# Patient Record
Sex: Female | Born: 1980 | Race: White | Hispanic: No | Marital: Married | Smoking: Never smoker
Health system: Southern US, Community
[De-identification: ages and names within clinical notes are randomized; demographics above are authoritative.]

## PROBLEM LIST (undated history)

## (undated) DIAGNOSIS — R519 Headache, unspecified: Secondary | ICD-10-CM

## (undated) DIAGNOSIS — K219 Gastro-esophageal reflux disease without esophagitis: Secondary | ICD-10-CM

## (undated) DIAGNOSIS — N200 Calculus of kidney: Secondary | ICD-10-CM

## (undated) DIAGNOSIS — R51 Headache: Secondary | ICD-10-CM

## (undated) DIAGNOSIS — O1205 Gestational edema, complicating the puerperium: Secondary | ICD-10-CM

## (undated) HISTORY — PX: WISDOM TOOTH EXTRACTION: SHX21

---

## 2002-04-04 ENCOUNTER — Other Ambulatory Visit: Admission: RE | Admit: 2002-04-04 | Discharge: 2002-04-04 | Payer: Self-pay | Admitting: Obstetrics and Gynecology

## 2002-07-14 ENCOUNTER — Ambulatory Visit (HOSPITAL_COMMUNITY): Admission: RE | Admit: 2002-07-14 | Discharge: 2002-07-14 | Payer: Self-pay | Admitting: Family Medicine

## 2002-07-14 ENCOUNTER — Encounter: Payer: Self-pay | Admitting: Family Medicine

## 2003-04-30 ENCOUNTER — Other Ambulatory Visit: Admission: RE | Admit: 2003-04-30 | Discharge: 2003-04-30 | Payer: Self-pay | Admitting: Obstetrics and Gynecology

## 2004-09-16 ENCOUNTER — Other Ambulatory Visit: Admission: RE | Admit: 2004-09-16 | Discharge: 2004-09-16 | Payer: Self-pay | Admitting: Obstetrics and Gynecology

## 2005-12-02 ENCOUNTER — Other Ambulatory Visit: Admission: RE | Admit: 2005-12-02 | Discharge: 2005-12-02 | Payer: Self-pay | Admitting: Obstetrics and Gynecology

## 2013-10-06 ENCOUNTER — Other Ambulatory Visit (HOSPITAL_COMMUNITY): Payer: Self-pay | Admitting: Obstetrics and Gynecology

## 2013-10-06 DIAGNOSIS — N96 Recurrent pregnancy loss: Secondary | ICD-10-CM

## 2013-10-12 ENCOUNTER — Ambulatory Visit (HOSPITAL_COMMUNITY)
Admission: RE | Admit: 2013-10-12 | Discharge: 2013-10-12 | Disposition: A | Discharge status: Home / Self Care | Payer: PRIVATE HEALTH INSURANCE | Source: Ambulatory Visit | Attending: Obstetrics and Gynecology | Admitting: Obstetrics and Gynecology

## 2013-10-12 DIAGNOSIS — N979 Female infertility, unspecified: Secondary | ICD-10-CM | POA: Insufficient documentation

## 2013-10-12 DIAGNOSIS — N96 Recurrent pregnancy loss: Secondary | ICD-10-CM

## 2013-10-12 MED ORDER — IOHEXOL 300 MG/ML  SOLN
20.0000 mL | Freq: Once | INTRAMUSCULAR | Status: AC | PRN
  Administered 2013-10-12: 10 mL

## 2014-09-19 ENCOUNTER — Other Ambulatory Visit: Payer: Self-pay | Admitting: Obstetrics and Gynecology

## 2014-10-02 ENCOUNTER — Other Ambulatory Visit (INDEPENDENT_AMBULATORY_CARE_PROVIDER_SITE_OTHER): Payer: PRIVATE HEALTH INSURANCE

## 2014-10-02 DIAGNOSIS — Z01818 Encounter for other preprocedural examination: Secondary | ICD-10-CM

## 2014-10-02 NOTE — Progress Notes (Signed)
Lab only ordered by outside doctor

## 2014-10-03 ENCOUNTER — Encounter (HOSPITAL_COMMUNITY): Payer: Self-pay

## 2014-10-03 ENCOUNTER — Encounter (HOSPITAL_COMMUNITY)
Admission: RE | Admit: 2014-10-03 | Discharge: 2014-10-03 | Disposition: A | Discharge status: Home / Self Care | Payer: PRIVATE HEALTH INSURANCE | Source: Ambulatory Visit | Attending: Obstetrics and Gynecology | Admitting: Obstetrics and Gynecology

## 2014-10-03 DIAGNOSIS — Z01812 Encounter for preprocedural laboratory examination: Secondary | ICD-10-CM | POA: Diagnosis not present

## 2014-10-03 HISTORY — DX: Headache: R51

## 2014-10-03 HISTORY — DX: Gastro-esophageal reflux disease without esophagitis: K21.9

## 2014-10-03 HISTORY — DX: Calculus of kidney: N20.0

## 2014-10-03 HISTORY — DX: Headache, unspecified: R51.9

## 2014-10-03 LAB — CBC WITH DIFFERENTIAL
BASOS ABS: 0 10*3/uL (ref 0.0–0.2)
Basos: 0 %
EOS: 3 %
Eosinophils Absolute: 0.1 10*3/uL (ref 0.0–0.4)
HCT: 38.8 % (ref 34.0–46.6)
Hemoglobin: 13.5 g/dL (ref 11.1–15.9)
IMMATURE GRANS (ABS): 0 10*3/uL (ref 0.0–0.1)
Immature Granulocytes: 0 %
LYMPHS ABS: 2.3 10*3/uL (ref 0.7–3.1)
Lymphs: 44 %
MCH: 30.1 pg (ref 26.6–33.0)
MCHC: 34.8 g/dL (ref 31.5–35.7)
MCV: 86 fL (ref 79–97)
Monocytes Absolute: 0.5 10*3/uL (ref 0.1–0.9)
Monocytes: 9 %
NEUTROS ABS: 2.3 10*3/uL (ref 1.4–7.0)
NEUTROS PCT: 44 %
Platelets: 280 10*3/uL (ref 150–379)
RBC: 4.49 x10E6/uL (ref 3.77–5.28)
RDW: 13.1 % (ref 12.3–15.4)
WBC: 5.2 10*3/uL (ref 3.4–10.8)

## 2014-10-03 LAB — BMP8+EGFR
BUN/Creatinine Ratio: 11 (ref 8–20)
BUN: 9 mg/dL (ref 6–20)
CALCIUM: 9 mg/dL (ref 8.7–10.2)
CHLORIDE: 103 mmol/L (ref 97–108)
CO2: 25 mmol/L (ref 18–29)
Creatinine, Ser: 0.81 mg/dL (ref 0.57–1.00)
GFR, EST AFRICAN AMERICAN: 112 mL/min/{1.73_m2} (ref >59)
GFR, EST NON AFRICAN AMERICAN: 97 mL/min/{1.73_m2} (ref >59)
Glucose: 84 mg/dL (ref 65–99)
Potassium: 4.3 mmol/L (ref 3.5–5.2)
SODIUM: 141 mmol/L (ref 134–144)

## 2014-10-03 LAB — HCG, QUANTITATIVE, PREGNANCY: hCG Quant: <1 m[IU]/mL

## 2014-10-03 NOTE — Patient Instructions (Addendum)
Your procedure is scheduled on:  Thursday, October 11, 2014  [facility]:  11:00 A.M.  Pick up the phone at the desk and dial [phone].  Call this number if you have problems the morning of surgery: [phone].  Remember: Do NOT eat food: AFTER MIDNIGHT WEDNESDAY Do NOT drink clear liquids after:  AFTER MIDNIGHT WEDNESDAY Take these medicines the morning of surgery with a SIP OF WATER: PEPCID  Do NOT wear jewelry (body piercing), metal hair clips/bobby pins, make-up, or nail polish. Do NOT wear lotions, powders, or perfumes.  You may wear deoderant. Do NOT shave for 48 hours prior to surgery. Do NOT bring valuables to the hospital. Contacts, dentures, or bridgework may not be worn into surgery.  Have a responsible adult drive you home and stay with you for 24 hours after your procedure

## 2014-10-10 NOTE — H&P (Signed)
Connie Logan:  Mullins, Gennett                ACCOUNT NO.:  [id number]  MEDICAL RECORD NO.:  [id number]  LOCATION:  [facility]                         FACILITY:  [facility]  PHYSICIAN:  Lenoard Adenichard J. Dedra Matsuo, M.D.DATE OF BIRTH:  1981-01-25  DATE OF ADMISSION:  09/18/2014 DATE OF DISCHARGE:                             HISTORY & PHYSICAL   CHIEF COMPLAINT:  Dysmenorrhea, dyspareunia, pelvic pain.  HISTORY OF PRESENT ILLNESS:  This is a 34 year old white female, G1, P0, who presents with increasing dyspareunia, pelvic pain, and signs and symptoms suggestive of endometriosis for surgical intervention.  ALLERGIES:  SHE HAS ALLERGIES TO SEPTRA.  MEDICATIONS:  Ultram for pain as needed.  SOCIAL HISTORY:  Noncontributory.  SURGICAL HISTORY:  Wisdom tooth extraction.  PREGNANCY HISTORY:  One pregnancy loss.  FAMILY HISTORY:  Heart disease, hypertension, and diabetes.  PHYSICAL EXAMINATION:  GENERAL:  She is a well-developed, well- nourished, white female, in no acute distress. HEENT:  Normal. NECK:  Supple.  Full range of motion. LUNGS:  Clear. HEART:  Regular rate and rhythm. ABDOMEN:  Soft, nontender.  Pelvic exam revealed anteflexed uterus.  No adnexal masses with bilateral adnexal tenderness. EXTREMITIES:  There are no cords. NEUROLOGIC:  Nonfocal. SKIN:  Intact.  IMPRESSION:  Symptomatic dysmenorrhea, dyspareunia and pelvic pain.  PLAN:  Proceed with da Vinci assisted laparoscopy, possible excision, ablation of endometriosis, possible chromopertubation.  Risks of anesthesia, infection, bleeding, injury to surrounding organs with possible need for repair was discussed.  Delayed versus immediate complications to include bowel and bladder injury noted, inability to cure pelvic pain was discussed.  The patient acknowledges and wishes to proceed.     Lenoard Adenichard J. Liberato Stansbery, M.D.     RJT/MEDQ  D:  10/10/2014  T:  10/10/2014  Job:  [id number]

## 2014-10-10 NOTE — Anesthesia Preprocedure Evaluation (Signed)
Anesthesia Evaluation  Patient identified by MRN, date of birth, ID band Patient awake    Reviewed: Allergy & Precautions, NPO status , Patient's Chart, lab work & pertinent test results  History of Anesthesia Complications Negative for: history of anesthetic complications  Airway Mallampati: II  TM Distance: >3 FB Neck ROM: Full    Dental no notable dental hx. (+) Dental Advisory Given   Pulmonary neg pulmonary ROS,  breath sounds clear to auscultation  Pulmonary exam normal       Cardiovascular Exercise Tolerance: Good negative cardio ROS  Rhythm:Regular Rate:Normal     Neuro/Psych  Headaches, negative psych ROS   GI/Hepatic Neg liver ROS, GERD-  Medicated and Controlled,  Endo/Other  negative endocrine ROS  Renal/GU negative Renal ROS  negative genitourinary   Musculoskeletal negative musculoskeletal ROS (+)   Abdominal   Peds negative pediatric ROS (+)  Hematology negative hematology ROS (+)   Anesthesia Other Findings   Reproductive/Obstetrics negative OB ROS                             Anesthesia Physical Anesthesia Plan  ASA: II  Anesthesia Plan: General   Post-op Pain Management:    Induction: Intravenous  Airway Management Planned: Oral ETT  Additional Equipment:   Intra-op Plan:   Post-operative Plan: Extubation in OR  Informed Consent: I have reviewed the patients History and Physical, chart, labs and discussed the procedure including the risks, benefits and alternatives for the proposed anesthesia with the patient or authorized representative who has indicated his/her understanding and acceptance.   Dental advisory given  Plan Discussed with: CRNA  Anesthesia Plan Comments:         Anesthesia Quick Evaluation

## 2014-10-11 ENCOUNTER — Ambulatory Visit (HOSPITAL_COMMUNITY): Payer: PRIVATE HEALTH INSURANCE | Admitting: Anesthesiology

## 2014-10-11 ENCOUNTER — Ambulatory Visit (HOSPITAL_COMMUNITY)
Admission: RE | Admit: 2014-10-11 | Discharge: 2014-10-11 | Disposition: A | Discharge status: Home / Self Care | Payer: PRIVATE HEALTH INSURANCE | Source: Ambulatory Visit | Attending: Obstetrics and Gynecology | Admitting: Obstetrics and Gynecology

## 2014-10-11 ENCOUNTER — Encounter (HOSPITAL_COMMUNITY)
Admission: RE | Disposition: A | Discharge status: Home / Self Care | Payer: Self-pay | Source: Ambulatory Visit | Attending: Obstetrics and Gynecology

## 2014-10-11 ENCOUNTER — Encounter (HOSPITAL_COMMUNITY): Payer: Self-pay | Admitting: Anesthesiology

## 2014-10-11 DIAGNOSIS — N803 Endometriosis of pelvic peritoneum: Secondary | ICD-10-CM | POA: Diagnosis not present

## 2014-10-11 DIAGNOSIS — N946 Dysmenorrhea, unspecified: Secondary | ICD-10-CM | POA: Insufficient documentation

## 2014-10-11 DIAGNOSIS — N8320 Unspecified ovarian cysts: Secondary | ICD-10-CM | POA: Insufficient documentation

## 2014-10-11 DIAGNOSIS — R102 Pelvic and perineal pain: Secondary | ICD-10-CM | POA: Insufficient documentation

## 2014-10-11 HISTORY — PX: ROBOTIC ASSISTED DIAGNOSTIC LAPAROSCOPY: SHX6532

## 2014-10-11 LAB — BASIC METABOLIC PANEL
Anion gap: 5 (ref 5–15)
BUN: 8 mg/dL (ref 6–23)
CHLORIDE: 105 meq/L (ref 96–112)
CO2: 30 mmol/L (ref 19–32)
CREATININE: 0.75 mg/dL (ref 0.50–1.10)
Calcium: 9.1 mg/dL (ref 8.4–10.5)
GFR calc non Af Amer: >90 mL/min (ref >90)
Glucose, Bld: 96 mg/dL (ref 70–99)
Potassium: 4.4 mmol/L (ref 3.5–5.1)
Sodium: 140 mmol/L (ref 135–145)

## 2014-10-11 LAB — HCG, SERUM, QUALITATIVE: Preg, Serum: NEGATIVE

## 2014-10-11 SURGERY — ROBOTIC ASSISTED DIAGNOSTIC LAPAROSCOPY
Anesthesia: General | Site: Abdomen

## 2014-10-11 MED ORDER — SODIUM CHLORIDE 0.9 % IJ SOLN
INTRAMUSCULAR | Status: AC
  Filled 2014-10-11: qty 50

## 2014-10-11 MED ORDER — DEXAMETHASONE SODIUM PHOSPHATE 10 MG/ML IJ SOLN
INTRAMUSCULAR | Status: AC
  Filled 2014-10-11: qty 1

## 2014-10-11 MED ORDER — DEXAMETHASONE SODIUM PHOSPHATE 10 MG/ML IJ SOLN
INTRAMUSCULAR | Status: DC | PRN
  Administered 2014-10-11: 10 mg via INTRAVENOUS

## 2014-10-11 MED ORDER — ONDANSETRON HCL 4 MG/2ML IJ SOLN
INTRAMUSCULAR | Status: AC
  Filled 2014-10-11: qty 2

## 2014-10-11 MED ORDER — BUPIVACAINE HCL (PF) 0.25 % IJ SOLN
INTRAMUSCULAR | Status: DC | PRN
  Administered 2014-10-11: 2 mL

## 2014-10-11 MED ORDER — ARTIFICIAL TEARS OP OINT
TOPICAL_OINTMENT | OPHTHALMIC | Status: DC | PRN
  Administered 2014-10-11: 1 via OPHTHALMIC

## 2014-10-11 MED ORDER — ROPIVACAINE HCL 5 MG/ML IJ SOLN
INTRAMUSCULAR | Status: AC
  Filled 2014-10-11: qty 60

## 2014-10-11 MED ORDER — KETOROLAC TROMETHAMINE 30 MG/ML IJ SOLN
INTRAMUSCULAR | Status: DC | PRN
  Administered 2014-10-11: 30 mg via INTRAVENOUS

## 2014-10-11 MED ORDER — SODIUM CHLORIDE 0.9 % IV SOLN
INTRAVENOUS | Status: DC | PRN
  Administered 2014-10-11: 60 mL

## 2014-10-11 MED ORDER — GLYCOPYRROLATE 0.2 MG/ML IJ SOLN
INTRAMUSCULAR | Status: DC | PRN
  Administered 2014-10-11: 0.4 mg via INTRAVENOUS

## 2014-10-11 MED ORDER — NEOSTIGMINE METHYLSULFATE 10 MG/10ML IV SOLN
INTRAVENOUS | Status: AC
  Filled 2014-10-11: qty 1

## 2014-10-11 MED ORDER — LIDOCAINE HCL (CARDIAC) 20 MG/ML IV SOLN
INTRAVENOUS | Status: AC
  Filled 2014-10-11: qty 5

## 2014-10-11 MED ORDER — PROPOFOL 10 MG/ML IV BOLUS
INTRAVENOUS | Status: AC
  Filled 2014-10-11: qty 20

## 2014-10-11 MED ORDER — BUPIVACAINE HCL (PF) 0.25 % IJ SOLN
INTRAMUSCULAR | Status: AC
  Filled 2014-10-11: qty 30

## 2014-10-11 MED ORDER — GLYCOPYRROLATE 0.2 MG/ML IJ SOLN
INTRAMUSCULAR | Status: AC
  Filled 2014-10-11: qty 4

## 2014-10-11 MED ORDER — METHYLENE BLUE 1 % INJ SOLN
INTRAMUSCULAR | Status: DC | PRN
  Administered 2014-10-11: 1 mL via SUBMUCOSAL

## 2014-10-11 MED ORDER — ACETAMINOPHEN 325 MG PO TABS: 325.0000 mg | ORAL_TABLET | ORAL | Status: DC | PRN

## 2014-10-11 MED ORDER — NEOSTIGMINE METHYLSULFATE 10 MG/10ML IV SOLN
INTRAVENOUS | Status: DC | PRN
  Administered 2014-10-11: 3 mg via INTRAVENOUS

## 2014-10-11 MED ORDER — OXYCODONE-ACETAMINOPHEN 5-325 MG PO TABS: 1.0000 | ORAL_TABLET | ORAL | Status: DC | PRN

## 2014-10-11 MED ORDER — FENTANYL CITRATE 0.05 MG/ML IJ SOLN
INTRAMUSCULAR | Status: AC
  Filled 2014-10-11: qty 2

## 2014-10-11 MED ORDER — METHYLENE BLUE 1 % INJ SOLN
INTRAMUSCULAR | Status: AC
  Filled 2014-10-11: qty 1

## 2014-10-11 MED ORDER — CEFAZOLIN SODIUM-DEXTROSE 2-3 GM-% IV SOLR
2.0000 g | INTRAVENOUS | Status: AC
  Administered 2014-10-11: 2 g via INTRAVENOUS

## 2014-10-11 MED ORDER — ONDANSETRON HCL 4 MG/2ML IJ SOLN
INTRAMUSCULAR | Status: DC | PRN
  Administered 2014-10-11: 4 mg via INTRAVENOUS

## 2014-10-11 MED ORDER — PROPOFOL 10 MG/ML IV BOLUS
INTRAVENOUS | Status: DC | PRN
  Administered 2014-10-11: 150 mg via INTRAVENOUS

## 2014-10-11 MED ORDER — OXYCODONE-ACETAMINOPHEN 5-325 MG PO TABS
1.0000 | ORAL_TABLET | Freq: Once | ORAL | Status: AC
  Administered 2014-10-11: 1 via ORAL

## 2014-10-11 MED ORDER — SCOPOLAMINE 1 MG/3DAYS TD PT72
1.0000 | MEDICATED_PATCH | Freq: Once | TRANSDERMAL | Status: DC
  Administered 2014-10-11: 1.5 mg via TRANSDERMAL

## 2014-10-11 MED ORDER — SODIUM CHLORIDE 0.9 % IJ SOLN
INTRAMUSCULAR | Status: AC
  Filled 2014-10-11: qty 10

## 2014-10-11 MED ORDER — LIDOCAINE HCL (CARDIAC) 20 MG/ML IV SOLN
INTRAVENOUS | Status: DC | PRN
  Administered 2014-10-11: 40 mg via INTRAVENOUS

## 2014-10-11 MED ORDER — ROCURONIUM BROMIDE 100 MG/10ML IV SOLN
INTRAVENOUS | Status: DC | PRN
  Administered 2014-10-11: 40 mg via INTRAVENOUS

## 2014-10-11 MED ORDER — ONDANSETRON HCL 4 MG/2ML IJ SOLN: 4.0000 mg | Freq: Once | INTRAMUSCULAR | Status: DC | PRN

## 2014-10-11 MED ORDER — CEFAZOLIN SODIUM-DEXTROSE 2-3 GM-% IV SOLR
INTRAVENOUS | Status: AC
  Filled 2014-10-11: qty 50

## 2014-10-11 MED ORDER — ACETAMINOPHEN 160 MG/5ML PO SOLN: 325.0000 mg | ORAL | Status: DC | PRN

## 2014-10-11 MED ORDER — FENTANYL CITRATE 0.05 MG/ML IJ SOLN
25.0000 ug | INTRAMUSCULAR | Status: DC | PRN
  Administered 2014-10-11 (×2): 25 ug via INTRAVENOUS

## 2014-10-11 MED ORDER — LACTATED RINGERS IV SOLN
INTRAVENOUS | Status: DC
  Administered 2014-10-11 (×3): via INTRAVENOUS

## 2014-10-11 MED ORDER — SCOPOLAMINE 1 MG/3DAYS TD PT72
MEDICATED_PATCH | TRANSDERMAL | Status: AC
  Administered 2014-10-11: 1.5 mg via TRANSDERMAL
  Filled 2014-10-11: qty 1

## 2014-10-11 MED ORDER — MIDAZOLAM HCL 2 MG/2ML IJ SOLN
INTRAMUSCULAR | Status: AC
  Filled 2014-10-11: qty 2

## 2014-10-11 MED ORDER — OXYCODONE-ACETAMINOPHEN 5-325 MG PO TABS
ORAL_TABLET | ORAL | Status: AC
  Filled 2014-10-11: qty 1

## 2014-10-11 MED ORDER — FENTANYL CITRATE 0.05 MG/ML IJ SOLN
INTRAMUSCULAR | Status: AC
  Filled 2014-10-11: qty 5

## 2014-10-11 MED ORDER — MIDAZOLAM HCL 5 MG/5ML IJ SOLN
INTRAMUSCULAR | Status: DC | PRN
  Administered 2014-10-11: 2 mg via INTRAVENOUS

## 2014-10-11 MED ORDER — FENTANYL CITRATE 0.05 MG/ML IJ SOLN
INTRAMUSCULAR | Status: DC | PRN
  Administered 2014-10-11 (×3): 50 ug via INTRAVENOUS
  Administered 2014-10-11 (×2): 100 ug via INTRAVENOUS

## 2014-10-11 SURGICAL SUPPLY — 53 items
BARRIER ADHS 3X4 INTERCEED (GAUZE/BANDAGES/DRESSINGS) IMPLANT
BRR ADH 4X3 ABS CNTRL BYND (GAUZE/BANDAGES/DRESSINGS)
CATH FOLEY 3WAY  5CC 16FR (CATHETERS) ×2
CATH FOLEY 3WAY 5CC 16FR (CATHETERS) ×1 IMPLANT
CHLORAPREP W/TINT 26ML (MISCELLANEOUS) ×3 IMPLANT
CLOTH BEACON ORANGE TIMEOUT ST (SAFETY) ×3 IMPLANT
CONT PATH 16OZ SNAP LID 3702 (MISCELLANEOUS) ×3 IMPLANT
COVER BACK TABLE 60X90IN (DRAPES) ×6 IMPLANT
COVER TIP SHEARS 8 DVNC (MISCELLANEOUS) ×1 IMPLANT
COVER TIP SHEARS 8MM DA VINCI (MISCELLANEOUS) ×2
DECANTER SPIKE VIAL GLASS SM (MISCELLANEOUS) ×3 IMPLANT
DRAPE WARM FLUID 44X44 (DRAPE) ×3 IMPLANT
DRSG COVADERM PLUS 2X2 (GAUZE/BANDAGES/DRESSINGS) ×12 IMPLANT
DRSG OPSITE POSTOP 3X4 (GAUZE/BANDAGES/DRESSINGS) ×3 IMPLANT
ELECT REM PT RETURN 9FT ADLT (ELECTROSURGICAL) ×3
ELECTRODE REM PT RTRN 9FT ADLT (ELECTROSURGICAL) ×1 IMPLANT
EVACUATOR SMOKE 8.L (FILTER) ×3 IMPLANT
GAUZE VASELINE 3X9 (GAUZE/BANDAGES/DRESSINGS) IMPLANT
GLOVE BIO SURGEON STRL SZ7.5 (GLOVE) ×6 IMPLANT
GOWN STRL REUS W/TWL LRG LVL3 (GOWN DISPOSABLE) ×21 IMPLANT
KIT ACCESSORY DA VINCI DISP (KITS) ×2
KIT ACCESSORY DVNC DISP (KITS) ×1 IMPLANT
LEGGING LITHOTOMY PAIR STRL (DRAPES) ×3 IMPLANT
LIQUID BAND (GAUZE/BANDAGES/DRESSINGS) ×3 IMPLANT
NEEDLE INSUFFLATION 150MM (ENDOMECHANICALS) ×3 IMPLANT
PACK ROBOT WH (CUSTOM PROCEDURE TRAY) ×3 IMPLANT
PAD POSITIONER PINK NONSTERILE (MISCELLANEOUS) ×3 IMPLANT
PAD PREP 24X48 CUFFED NSTRL (MISCELLANEOUS) ×6 IMPLANT
PROTECTOR NERVE ULNAR (MISCELLANEOUS) ×6 IMPLANT
SET CYSTO W/LG BORE CLAMP LF (SET/KITS/TRAYS/PACK) IMPLANT
SET IRRIG TUBING LAPAROSCOPIC (IRRIGATION / IRRIGATOR) ×3 IMPLANT
SET TRI-LUMEN FLTR TB AIRSEAL (TUBING) ×2 IMPLANT
SUT VIC AB 0 CT1 27 (SUTURE) ×6
SUT VIC AB 0 CT1 27XBRD ANBCTR (SUTURE) ×2 IMPLANT
SUT VIC AB 0 CT1 27XBRD ANTBC (SUTURE) IMPLANT
SUT VICRYL 0 UR6 27IN ABS (SUTURE) ×3 IMPLANT
SUT VICRYL RAPIDE 4/0 PS 2 (SUTURE) ×6 IMPLANT
SYR 50ML LL SCALE MARK (SYRINGE) ×3 IMPLANT
SYRINGE 10CC LL (SYRINGE) ×3 IMPLANT
SYSTEM CONVERTIBLE TROCAR (TROCAR) IMPLANT
TIP UTERINE 5.1X6CM LAV DISP (MISCELLANEOUS) IMPLANT
TIP UTERINE 6.7X10CM GRN DISP (MISCELLANEOUS) IMPLANT
TIP UTERINE 6.7X6CM WHT DISP (MISCELLANEOUS) ×2 IMPLANT
TIP UTERINE 6.7X8CM BLUE DISP (MISCELLANEOUS) ×2 IMPLANT
TOWEL OR 17X24 6PK STRL BLUE (TOWEL DISPOSABLE) ×9 IMPLANT
TROCAR DISP BLADELESS 8 DVNC (TROCAR) ×1 IMPLANT
TROCAR DISP BLADELESS 8MM (TROCAR) ×2
TROCAR PORT AIRSEAL 5X120 (TROCAR) ×2 IMPLANT
TROCAR XCEL 12X100 BLDLESS (ENDOMECHANICALS) IMPLANT
TROCAR XCEL NON-BLD 5MMX100MML (ENDOMECHANICALS) ×3 IMPLANT
TROCAR Z-THREAD 12X150 (TROCAR) ×3 IMPLANT
WARMER LAPAROSCOPE (MISCELLANEOUS) ×3 IMPLANT
WATER STERILE IRR 1000ML POUR (IV SOLUTION) ×9 IMPLANT

## 2014-10-11 NOTE — Anesthesia Postprocedure Evaluation (Signed)
  Anesthesia Post-op Note  Patient: Connie Logan  Procedure(s) Performed: Procedure(s) (LRB): ROBOTIC ASSISTED DIAGNOSTIC LAPAROSCOPY EXCISION/ABLATION OF ENDOMETRIOSIS  (N/A)  Patient Location: PACU  Anesthesia Type: General  Level of Consciousness: awake and alert   Airway and Oxygen Therapy: Patient Spontanous Breathing  Post-op Pain: mild  Post-op Assessment: Post-op Vital signs reviewed, Patient's Cardiovascular Status Stable, Respiratory Function Stable, Patent Airway and No signs of Nausea or vomiting  Last Vitals:  Filed Vitals:   10/11/14 0950  BP: 118/59  Pulse: 75  Temp: 37 C  Resp: 18    Post-op Vital Signs: stable   Complications: No apparent anesthesia complications

## 2014-10-11 NOTE — Op Note (Signed)
10/11/2014  12:20 PM  PATIENT:  Connie Logan  34 y.o. female  PRE-OPERATIVE DIAGNOSIS:  Pelvic Pain, Dysmenorrhea  POST-OPERATIVE DIAGNOSIS:  pelvic pain,dysmenorrhea Pelvic adhesions RIGHT OVARIAN CYST  PROCEDURE:  Procedure(s): ROBOTIC ASSISTED DIAGNOSTIC LAPAROSCOPY EXCISION/ABLATION OF ENDOMETRIOSIS  LYSIS OF ADHESIONS RIGHT OVARIAN CYSTECTOMY CHROMOPERTURBATION  SURGEON:  Surgeon(s): Lenoard Adenichard J Cherell Colvin, MD  ASSISTANTS: Denyse AmassBhambri, CNM   ANESTHESIA:   local and general  ESTIMATED BLOOD LOSS: minimal  DRAINS: none   LOCAL MEDICATIONS USED:  MARCAINE    and Amount: 10 ml  SPECIMEN:  Source of Specimen:  PERITONEAL IMPLANTS, OVARIAN CYST  DISPOSITION OF SPECIMEN:  PATHOLOGY  COUNTS:  YES  DICTATION #: [id number]  PLAN OF CARE: DC HOME  PATIENT DISPOSITION:  PACU - hemodynamically stable.

## 2014-10-11 NOTE — Transfer of Care (Signed)
Immediate Anesthesia Transfer of Care Note  Patient: Connie Logan  Procedure(s) Performed: Procedure(s): ROBOTIC ASSISTED DIAGNOSTIC LAPAROSCOPY EXCISION/ABLATION OF ENDOMETRIOSIS  (N/A)  Patient Location: PACU  Anesthesia Type:General  Level of Consciousness: awake  Airway & Oxygen Therapy: Patient Spontanous Breathing and Patient connected to nasal cannula oxygen  Post-op Assessment: Report given to PACU RN and Post -op Vital signs reviewed and stable  Post vital signs: stable  Complications: No apparent anesthesia complications

## 2014-10-11 NOTE — Discharge Instructions (Signed)
DISCHARGE INSTRUCTIONS: Laparoscopy  The following instructions have been prepared to help you care for yourself upon your return home today.  May take Ibuprofen products after 6:00 p.m. As needed for pain.  May remove Scop patch on or before 10/13/2014.  Wound care:  Do not get the incision wet for the first 24 hours. The incision should be kept clean and dry.  The Honeycomb dressings (Over your belly button)  may be removed 48 hours after surgery.  Should the incision become sore, red, and swollen after the first week, check with your doctor.  Personal hygiene:  Shower the day after your procedure.  Activity and limitations:  Do NOT drive or operate any equipment today.  Do NOT lift anything more than 15 pounds for 2-3 weeks after surgery.  Do NOT rest in bed all day.  Walking is encouraged. Walk each day, starting slowly with 5-minute walks 3 or 4 times a day. Slowly increase the length of your walks.  Walk up and down stairs slowly.  Do NOT do strenuous activities, such as golfing, playing tennis, bowling, running, biking, weight lifting, gardening, mowing, or vacuuming for 2-4 weeks. Ask your doctor when it is okay to start.  Diet: Eat a light meal as desired this evening. You may resume your usual diet tomorrow.  Return to work: This is dependent on the type of work you do. For the most part you can return to a desk job within a week of surgery. If you are more active at work, please discuss this with your doctor.  What to expect after your surgery: You may have a slight burning sensation when you urinate on the first day. You may have a very small amount of blood in the urine. Expect to have a small amount of vaginal discharge/light bleeding for 1-2 weeks. It is not unusual to have abdominal soreness and bruising for up to 2 weeks. You may be tired and need more rest for about 1 week. You may experience shoulder pain for 24-72 hours. Lying flat in bed may relieve  it.  Call your doctor for any of the following:  Develop a fever of 100.4 or greater  Inability to urinate 6 hours after discharge from hospital  Severe pain not relieved by pain medications  Persistent of heavy bleeding at incision site  Redness or swelling around incision site after a week  Increasing nausea or vomiting

## 2014-10-11 NOTE — Progress Notes (Signed)
Patient ID: Connie Logan, female   DOB: 12/04/1980, 34 y.o.   MRN: [id number] Patient seen and examined. Consent witnessed and signed. No changes noted. Update completed.

## 2014-10-11 NOTE — Op Note (Signed)
Connie Logan:  Logan, Connie                ACCOUNT NO.:  [id number]  MEDICAL RECORD NO.:  [id number]  LOCATION:  [facility]                          FACILITY:  [facility]  PHYSICIAN:  Lenoard Adenichard J. Lisvet Rasheed, M.D.DATE OF BIRTH:  June 06, 1981  DATE OF PROCEDURE: DATE OF DISCHARGE:  10/11/2014                              OPERATIVE REPORT   PREOPERATIVE DIAGNOSES:  Pelvic pain, dysmenorrhea, and dyspareunia.  POSTOPERATIVE DIAGNOSES:  Pelvic endometriosis, right ovarian cyst, pelvic adhesions.  PROCEDURES:  Da Vinci-assisted laparoscopic excision, ablation of endometriosis, lysis of adhesions, right ovarian cystectomy, chromopertubation.  SURGEON:  Lenoard Adenichard J. Claiborne Stroble, M.D.  ASSISTANTSDenyse Amass:  Bhambri, CNM.  ESTIMATED BLOOD LOSS:  Less than 50 mL.  COMPLICATIONS:  None.  DRAINS:  None.  COUNTS:  Correct.  DISPOSITION:  The patient to recovery in good condition.  BRIEF OPERATIVE NOTE:  After being apprised of the risks of anesthesia, infection, bleeding, injury to surrounding organs with possible need for repair, delayed versus immediate complications to include bowel and bladder injury, possible need for repair, the patient was brought to the operating room where she was administered a general anesthetic without complications, prepped and draped in usual sterile fashion.  Foley catheter placed.  RUMI retractor placed per vagina without difficulty. After exam under anesthesia revealed an anteflexed uterus and no adnexal masses.  At this time, an infraumbilical incision was made with a scalpel.  A Veress needle was placed with opening pressure of 2, 3 liters of CO2 insufflated without difficulty.  Trocar was placed atraumatically.  Pictures were taken.  Findings include a normal liver, gallbladder bed, normal appendiceal area, and normal diaphragm.  There was some surface endometriosis in the posterior cul-de-sac.  A normal anterior cul-de-sac.  There was some surface endometriosis on the right and the left  ovary in addition to an excrescence pedunculated cyst emanating from the right ovary.  At this time, the robot was docked in the standard fashion with one port on either side and a 5-mm port on the left.  Deep Trendelenburg position has been established.  Using sharp dissection, the right ovarian cyst was undermined and removed without difficulty.  The cul-de-sac was then addressed whereby the cul-de-sac containing implants of probable endometriosis was undermined and excised sharply.  Hemostasis was then achieved using monopolar cautery. Specimens were sent for permanent section along the left and right ovary.  There were brownish powder burns consistent with endometriosis, which were excised and ablated using monopolar cautery.  At this point, both ureters were peristalsing normally.  The left adnexa was slightly obscured from adhesions from the sigmoid colon, which were lysed sharply, freeing the adhesions from the left pelvic brim.  At this time, good hemostasis was noted.  Irrigation was accomplished. Chromopertubation was performed without difficulty and normal reflux from the left tube.  There was minimal reflux from an otherwise normal- appearing right tube.  At this time, the procedure was terminated.  All ports were removed under direct visualization.  CO2 was released. Incisions were closed using 4-0 Vicryl and Dermabond.  The umbilical incision was closed using 0 Vicryl, 3-0 Monocryl.  Dermabond was placed as well and a pressure dressing was placed.  The patient tolerated  the procedure well, was awakened and transferred to recovery room in good condition.     Lenoard Aden, M.D.     RJT/MEDQ  D:  10/11/2014  T:  10/11/2014  Job:  [id number]

## 2014-10-12 ENCOUNTER — Encounter (HOSPITAL_COMMUNITY): Payer: Self-pay | Admitting: Obstetrics and Gynecology

## 2014-11-06 ENCOUNTER — Encounter: Payer: Self-pay | Admitting: Family Medicine

## 2014-11-06 ENCOUNTER — Ambulatory Visit (INDEPENDENT_AMBULATORY_CARE_PROVIDER_SITE_OTHER): Payer: PRIVATE HEALTH INSURANCE | Admitting: Family Medicine

## 2014-11-06 VITALS — BP 126/77 | HR 77 | Temp 97.0°F | Ht 63.0 in | Wt 150.0 lb

## 2014-11-06 DIAGNOSIS — J069 Acute upper respiratory infection, unspecified: Secondary | ICD-10-CM

## 2014-11-06 MED ORDER — METHYLPREDNISOLONE ACETATE 80 MG/ML IJ SUSP
80.0000 mg | Freq: Once | INTRAMUSCULAR | Status: AC
  Administered 2014-11-06: 80 mg via INTRAMUSCULAR

## 2014-11-06 MED ORDER — AZITHROMYCIN 250 MG PO TABS: ORAL_TABLET | ORAL | Status: DC

## 2014-11-06 NOTE — Progress Notes (Signed)
   Subjective:    Patient ID: Margorie Johnesh M Yeakle, female    DOB: 02/20/1981, 34 y.o.   MRN: [id number]  HPI C/o uri sx's and facial discomfort  Review of Systems  Constitutional: Negative for fever.  HENT: Negative for ear pain.   Eyes: Negative for discharge.  Respiratory: Negative for cough.   Cardiovascular: Negative for chest pain.  Gastrointestinal: Negative for abdominal distention.  Endocrine: Negative for polyuria.  Genitourinary: Negative for difficulty urinating.  Musculoskeletal: Negative for gait problem and neck pain.  Skin: Negative for color change and rash.  Neurological: Negative for speech difficulty and headaches.  Psychiatric/Behavioral: Negative for agitation.       Objective:    BP 126/77 mmHg  Pulse 77  Temp(Src) 97 F (36.1 C) (Oral)  Ht 5\' 3"  (1.6 m)  Wt 150 lb (68.04 kg)  BMI 26.58 kg/m2  LMP 11/02/2014 Physical Exam  Constitutional: She is oriented to person, place, and time. She appears well-developed and well-nourished.  HENT:  Head: Normocephalic and atraumatic.  Mouth/Throat: Oropharynx is clear and moist.  Eyes: Pupils are equal, round, and reactive to light.  Neck: Normal range of motion. Neck supple.  Cardiovascular: Normal rate and regular rhythm.   No murmur heard. Pulmonary/Chest: Effort normal and breath sounds normal.  Abdominal: Soft. Bowel sounds are normal. There is no tenderness.  Neurological: She is alert and oriented to person, place, and time.  Skin: Skin is warm and dry.  Psychiatric: She has a normal mood and affect.          Assessment & Plan:     ICD-9-CM ICD-10-CM   1. URI (upper respiratory infection) 465.9 J06.9 methylPREDNISolone acetate (DEPO-MEDROL) injection 80 mg     azithromycin (ZITHROMAX) 250 MG tablet   Push po fluids, rest, tylenol and motrin otc prn as directed for fever, arthralgias, and myalgias.  Follow up prn if sx's continue or persist.  No Follow-up on file.  Deatra CanterWilliam J Sharmane Dame FNP

## 2014-12-27 ENCOUNTER — Encounter (INDEPENDENT_AMBULATORY_CARE_PROVIDER_SITE_OTHER): Payer: Self-pay

## 2014-12-27 ENCOUNTER — Other Ambulatory Visit (INDEPENDENT_AMBULATORY_CARE_PROVIDER_SITE_OTHER): Payer: PRIVATE HEALTH INSURANCE

## 2014-12-27 DIAGNOSIS — N979 Female infertility, unspecified: Secondary | ICD-10-CM | POA: Diagnosis not present

## 2014-12-27 NOTE — Progress Notes (Signed)
Lab only 

## 2014-12-28 LAB — PROGESTERONE: PROGESTERONE: 34.2 ng/mL

## 2015-01-14 ENCOUNTER — Other Ambulatory Visit (INDEPENDENT_AMBULATORY_CARE_PROVIDER_SITE_OTHER): Payer: PRIVATE HEALTH INSURANCE

## 2015-01-14 DIAGNOSIS — N979 Female infertility, unspecified: Secondary | ICD-10-CM

## 2015-01-14 NOTE — Addendum Note (Signed)
Addended by: Prescott GumLAND, Trudee Chirino M on: 01/14/2015 12:39 PM   Modules accepted: Orders

## 2015-01-14 NOTE — Addendum Note (Signed)
Addended by: Prescott GumLAND, Norvell Ureste M on: 01/14/2015 12:40 PM   Modules accepted: Orders

## 2015-01-14 NOTE — Progress Notes (Signed)
Lab only 

## 2015-01-15 LAB — BETA HCG QUANT (REF LAB): HCG QUANT: 3377 m[IU]/mL

## 2015-01-15 LAB — PROGESTERONE: PROGESTERONE: 63.7 ng/mL

## 2015-01-16 ENCOUNTER — Other Ambulatory Visit (INDEPENDENT_AMBULATORY_CARE_PROVIDER_SITE_OTHER): Payer: PRIVATE HEALTH INSURANCE

## 2015-01-16 DIAGNOSIS — Z3201 Encounter for pregnancy test, result positive: Secondary | ICD-10-CM

## 2015-01-16 NOTE — Progress Notes (Signed)
Lab only 

## 2015-01-16 NOTE — Addendum Note (Signed)
Addended by: Tommas OlpHANDY, Sheilla Maris N on: 01/16/2015 02:08 PM   Modules accepted: Orders

## 2015-01-17 LAB — BETA HCG QUANT (REF LAB): hCG Quant: 6847 m[IU]/mL

## 2015-01-18 ENCOUNTER — Other Ambulatory Visit (INDEPENDENT_AMBULATORY_CARE_PROVIDER_SITE_OTHER): Payer: PRIVATE HEALTH INSURANCE

## 2015-01-18 DIAGNOSIS — Z3201 Encounter for pregnancy test, result positive: Secondary | ICD-10-CM | POA: Diagnosis not present

## 2015-01-18 LAB — BETA HCG QUANT (REF LAB): hCG Quant: 11174 m[IU]/mL

## 2015-01-18 NOTE — Progress Notes (Signed)
Lab for Dr Olivia Mackieichard Taavon

## 2015-01-30 ENCOUNTER — Ambulatory Visit: Payer: PRIVATE HEALTH INSURANCE | Admitting: Family

## 2015-07-11 ENCOUNTER — Ambulatory Visit (INDEPENDENT_AMBULATORY_CARE_PROVIDER_SITE_OTHER): Payer: PRIVATE HEALTH INSURANCE

## 2015-07-11 DIAGNOSIS — Z23 Encounter for immunization: Secondary | ICD-10-CM

## 2015-08-06 ENCOUNTER — Other Ambulatory Visit: Payer: PRIVATE HEALTH INSURANCE

## 2015-08-06 DIAGNOSIS — M545 Low back pain, unspecified: Secondary | ICD-10-CM

## 2015-08-06 NOTE — Progress Notes (Signed)
Lab only 

## 2015-08-08 LAB — URINE CULTURE

## 2015-08-22 ENCOUNTER — Encounter (HOSPITAL_COMMUNITY): Payer: Self-pay

## 2015-08-22 ENCOUNTER — Inpatient Hospital Stay (HOSPITAL_COMMUNITY)
Admission: AD | Admit: 2015-08-22 | Discharge: 2015-08-22 | Disposition: A | Discharge status: Home / Self Care | Payer: PRIVATE HEALTH INSURANCE | Source: Ambulatory Visit | Attending: Obstetrics and Gynecology | Admitting: Obstetrics and Gynecology

## 2015-08-22 DIAGNOSIS — O1414 Severe pre-eclampsia complicating childbirth: Secondary | ICD-10-CM | POA: Diagnosis not present

## 2015-08-22 DIAGNOSIS — O1424 HELLP syndrome, complicating childbirth: Secondary | ICD-10-CM | POA: Diagnosis not present

## 2015-08-22 LAB — COMPREHENSIVE METABOLIC PANEL
ALBUMIN: 2.5 g/dL — AB (ref 3.5–5.0)
ALT: 36 U/L (ref 14–54)
AST: 40 U/L (ref 15–41)
Alkaline Phosphatase: 134 U/L — ABNORMAL HIGH (ref 38–126)
Anion gap: 9 (ref 5–15)
BILIRUBIN TOTAL: 0.5 mg/dL (ref 0.3–1.2)
BUN: 10 mg/dL (ref 6–20)
CO2: 20 mmol/L — ABNORMAL LOW (ref 22–32)
Calcium: 8.1 mg/dL — ABNORMAL LOW (ref 8.9–10.3)
Chloride: 105 mmol/L (ref 101–111)
Creatinine, Ser: 0.72 mg/dL (ref 0.44–1.00)
GFR calc Af Amer: >60 mL/min (ref >60)
GFR calc non Af Amer: >60 mL/min (ref >60)
GLUCOSE: 105 mg/dL — AB (ref 65–99)
Potassium: 3.4 mmol/L — ABNORMAL LOW (ref 3.5–5.1)
SODIUM: 134 mmol/L — AB (ref 135–145)
Total Protein: 6 g/dL — ABNORMAL LOW (ref 6.5–8.1)

## 2015-08-22 LAB — CBC
HEMATOCRIT: 28.8 % — AB (ref 36.0–46.0)
Hemoglobin: 9.6 g/dL — ABNORMAL LOW (ref 12.0–15.0)
MCH: 27.5 pg (ref 26.0–34.0)
MCHC: 33.3 g/dL (ref 30.0–36.0)
MCV: 82.5 fL (ref 78.0–100.0)
Platelets: 184 10*3/uL (ref 150–400)
RBC: 3.49 MIL/uL — ABNORMAL LOW (ref 3.87–5.11)
RDW: 13.8 % (ref 11.5–15.5)
WBC: 17.8 10*3/uL — ABNORMAL HIGH (ref 4.0–10.5)

## 2015-08-22 MED ORDER — BETAMETHASONE SOD PHOS & ACET 6 (3-3) MG/ML IJ SUSP
12.0000 mg | Freq: Once | INTRAMUSCULAR | Status: AC
  Administered 2015-08-22: 12 mg via INTRAMUSCULAR
  Filled 2015-08-22: qty 2

## 2015-08-22 NOTE — MAU Provider Note (Signed)
History   History of upper abdominal pain and elevated but stable LFTs for fu. No headache or epigastric pain. Good FM . No bleeding. No CP or SOB.  CSN: [id number]  Arrival date and time: 08/22/15 1311   None  CBC    Component Value Date/Time   WBC 17.8* 08/22/2015 1326   WBC 5.2 10/02/2014 0843   RBC 3.49* 08/22/2015 1326   RBC 4.49 10/02/2014 0843   HGB 9.6* 08/22/2015 1326   HCT 28.8* 08/22/2015 1326   PLT 184 08/22/2015 1326   MCV 82.5 08/22/2015 1326   MCH 27.5 08/22/2015 1326   MCH 30.1 10/02/2014 0843   MCHC 33.3 08/22/2015 1326   MCHC 34.8 10/02/2014 0843   RDW 13.8 08/22/2015 1326   RDW 13.1 10/02/2014 0843   LYMPHSABS 2.3 10/02/2014 0843   EOSABS 0.1 10/02/2014 0843   BASOSABS 0.0 10/02/2014 0843   CMP     Component Value Date/Time   NA 134* 08/22/2015 1326   NA 141 10/02/2014 0843   K 3.4* 08/22/2015 1326   CL 105 08/22/2015 1326   CO2 20* 08/22/2015 1326   GLUCOSE 105* 08/22/2015 1326   GLUCOSE 84 10/02/2014 0843   BUN 10 08/22/2015 1326   BUN 9 10/02/2014 0843   CREATININE 0.72 08/22/2015 1326   CALCIUM 8.1* 08/22/2015 1326   PROT 6.0* 08/22/2015 1326   ALBUMIN 2.5* 08/22/2015 1326   AST 40 08/22/2015 1326   ALT 36 08/22/2015 1326   ALKPHOS 134* 08/22/2015 1326   BILITOT 0.5 08/22/2015 1326   GFRNONAA >60 08/22/2015 1326   GFRAA >60 08/22/2015 1326     BP 150/98 mmHg  Pulse 91  Temp(Src) 97.4 F (36.3 C) (Oral)  Resp 18  Ht 5\' 3"  (1.6 m)  Wt 86.183 kg (190 lb)  BMI 33.67 kg/m2  LMP 11/02/2014     Chief Complaint  Patient presents with  . Decreased Fetal Movement   HPI  OB History    Gravida Para Term Preterm AB TAB SAB Ectopic Multiple Living   3  0 0 2  2   0      Past Medical History  Diagnosis Date  . Kidney stones   . Headache     migraines  . GERD (gastroesophageal reflux disease)     Past Surgical History  Procedure Laterality Date  . Wisdom tooth extraction    . Robotic assisted diagnostic laparoscopy N/A  10/11/2014    Procedure: ROBOTIC ASSISTED DIAGNOSTIC LAPAROSCOPY EXCISION/ABLATION OF ENDOMETRIOSIS ;  Surgeon: Lenoard Adenichard J Yamaira Spinner, MD;  Location: [facility];  Service: Gynecology;  Laterality: N/A;    Family History  Problem Relation Age of Onset  . Hypertension Mother   . Cancer Mother   . Cancer Maternal Grandmother     Social History  Substance Use Topics  . Smoking status: Never Smoker   . Smokeless tobacco: Never Used  . Alcohol Use: Yes     Comment: occ    Allergies:  Allergies  Allergen Reactions  . Septra [Sulfamethoxazole-Trimethoprim] Rash    Prescriptions prior to admission  Medication Sig Dispense Refill Last Dose  . acetaminophen (TYLENOL) 500 MG tablet Take 500 mg by mouth every 6 (six) hours as needed for mild pain.     Marland Kitchen. azithromycin (ZITHROMAX) 250 MG tablet Take 2 po first day and then one po qd x 4 days 6 tablet 0   . Famotidine (PEPCID PO) Take 1 tablet by mouth daily as needed (indigestion).   10/11/2014 at  Unknown time  . ibuprofen (ADVIL,MOTRIN) 800 MG tablet Take 800 mg by mouth every 8 (eight) hours as needed for moderate pain.       ROS Physical Exam  NCAT HEENT : nl Neck : supple with FROM Lungs: CTA CV: RRR ABD: Gravid, NT No CVAT Ext : neg c/c/e Neuro: nonfocal Skin: intact Blood pressure 150/98, pulse 91, temperature 97.4 F (36.3 C), temperature source Oral, resp. rate 18, height  (1.6 m), weight 86.183 kg (190 lb), last menstrual period 11/02/2014.  Physical Exam  MAU Course  Procedures  MDM Na CBC    Component Value Date/Time   WBC 17.8* 08/22/2015 1326   WBC 5.2 10/02/2014 0843   RBC 3.49* 08/22/2015 1326   RBC 4.49 10/02/2014 0843   HGB 9.6* 08/22/2015 1326   HCT 28.8* 08/22/2015 1326   PLT 184 08/22/2015 1326   MCV 82.5 08/22/2015 1326   MCH 27.5 08/22/2015 1326   MCH 30.1 10/02/2014 0843   MCHC 33.3 08/22/2015 1326   MCHC 34.8 10/02/2014 0843   RDW 13.8 08/22/2015 1326   RDW 13.1 10/02/2014 0843   LYMPHSABS 2.3  10/02/2014 0843   EOSABS 0.1 10/02/2014 0843   BASOSABS 0.0 10/02/2014 0843    CMP pending  Assessment and Plan  36+ weeks Elevated BP with no s/s PEC Labs improved BMZ complete DC home [facility] Fu office 4 days PEC precautions  Melody Cirrincione J 08/22/2015, 2:00 PM

## 2015-08-22 NOTE — MAU Note (Signed)
Pt with c/o epigastric pain starting on Tuesday night and resolving after a few hours. Pt was at MD office yesterday and had lab work and u/s. Pt here for BP check, lab work, and betamethasone injection. Pt also reports the baby not moving as much as normal since 10pm last night. Pt has been eating and drinking normally.

## 2015-08-24 ENCOUNTER — Inpatient Hospital Stay (HOSPITAL_COMMUNITY)
Admission: AD | Admit: 2015-08-24 | Discharge: 2015-08-27 | DRG: 766 | Disposition: A | Discharge status: Home / Self Care | Payer: PRIVATE HEALTH INSURANCE | Source: Ambulatory Visit | Attending: Obstetrics and Gynecology | Admitting: Obstetrics and Gynecology

## 2015-08-24 ENCOUNTER — Inpatient Hospital Stay (HOSPITAL_COMMUNITY): Payer: PRIVATE HEALTH INSURANCE | Admitting: Anesthesiology

## 2015-08-24 ENCOUNTER — Encounter (HOSPITAL_COMMUNITY)
Admission: AD | Disposition: A | Discharge status: Home / Self Care | Payer: Self-pay | Source: Ambulatory Visit | Attending: Obstetrics and Gynecology

## 2015-08-24 ENCOUNTER — Encounter (HOSPITAL_COMMUNITY): Payer: Self-pay | Admitting: *Deleted

## 2015-08-24 DIAGNOSIS — Z3A37 37 weeks gestation of pregnancy: Secondary | ICD-10-CM | POA: Diagnosis not present

## 2015-08-24 DIAGNOSIS — K219 Gastro-esophageal reflux disease without esophagitis: Secondary | ICD-10-CM | POA: Diagnosis present

## 2015-08-24 DIAGNOSIS — O1414 Severe pre-eclampsia complicating childbirth: Secondary | ICD-10-CM | POA: Diagnosis present

## 2015-08-24 DIAGNOSIS — R1013 Epigastric pain: Secondary | ICD-10-CM | POA: Diagnosis present

## 2015-08-24 DIAGNOSIS — O1424 HELLP syndrome, complicating childbirth: Principal | ICD-10-CM | POA: Diagnosis present

## 2015-08-24 DIAGNOSIS — O9962 Diseases of the digestive system complicating childbirth: Secondary | ICD-10-CM | POA: Diagnosis present

## 2015-08-24 DIAGNOSIS — O1410 Severe pre-eclampsia, unspecified trimester: Secondary | ICD-10-CM | POA: Diagnosis present

## 2015-08-24 DIAGNOSIS — O1205 Gestational edema, complicating the puerperium: Secondary | ICD-10-CM | POA: Clinically undetermined

## 2015-08-24 DIAGNOSIS — O1413 Severe pre-eclampsia, third trimester: Secondary | ICD-10-CM

## 2015-08-24 HISTORY — DX: Gestational edema, complicating the puerperium: O12.05

## 2015-08-24 HISTORY — DX: Severe pre-eclampsia, unspecified trimester: O14.10

## 2015-08-24 LAB — COMPREHENSIVE METABOLIC PANEL
ALT: 58 U/L — ABNORMAL HIGH (ref 14–54)
AST: 71 U/L — ABNORMAL HIGH (ref 15–41)
Albumin: 2.5 g/dL — ABNORMAL LOW (ref 3.5–5.0)
Alkaline Phosphatase: 131 U/L — ABNORMAL HIGH (ref 38–126)
Anion gap: 8 (ref 5–15)
BUN: 11 mg/dL (ref 6–20)
CO2: 21 mmol/L — ABNORMAL LOW (ref 22–32)
Calcium: 8.1 mg/dL — ABNORMAL LOW (ref 8.9–10.3)
Chloride: 107 mmol/L (ref 101–111)
Creatinine, Ser: 0.68 mg/dL (ref 0.44–1.00)
GFR calc Af Amer: >60 mL/min (ref >60)
GFR calc non Af Amer: >60 mL/min (ref >60)
Glucose, Bld: 80 mg/dL (ref 65–99)
Potassium: 4 mmol/L (ref 3.5–5.1)
Sodium: 136 mmol/L (ref 135–145)
Total Bilirubin: 0.6 mg/dL (ref 0.3–1.2)
Total Protein: 5.6 g/dL — ABNORMAL LOW (ref 6.5–8.1)

## 2015-08-24 LAB — URINALYSIS, ROUTINE W REFLEX MICROSCOPIC
Bilirubin Urine: NEGATIVE
Glucose, UA: NEGATIVE mg/dL
Ketones, ur: NEGATIVE mg/dL
Nitrite: NEGATIVE
Protein, ur: 100 mg/dL — AB
Specific Gravity, Urine: 1.01 (ref 1.005–1.030)
pH: 6.5 (ref 5.0–8.0)

## 2015-08-24 LAB — URINE MICROSCOPIC-ADD ON

## 2015-08-24 LAB — PROTEIN / CREATININE RATIO, URINE
Creatinine, Urine: 30 mg/dL
Protein Creatinine Ratio: 6.97 mg/mg{Cre} — ABNORMAL HIGH (ref 0.00–0.15)
Total Protein, Urine: 209 mg/dL

## 2015-08-24 LAB — URIC ACID: Uric Acid, Serum: 6 mg/dL (ref 2.3–6.6)

## 2015-08-24 LAB — CBC
HCT: 30.7 % — ABNORMAL LOW (ref 36.0–46.0)
Hemoglobin: 10.2 g/dL — ABNORMAL LOW (ref 12.0–15.0)
MCH: 27.6 pg (ref 26.0–34.0)
MCHC: 33.2 g/dL (ref 30.0–36.0)
MCV: 83 fL (ref 78.0–100.0)
Platelets: 223 10*3/uL (ref 150–400)
RBC: 3.7 MIL/uL — ABNORMAL LOW (ref 3.87–5.11)
RDW: 14 % (ref 11.5–15.5)
WBC: 19.1 10*3/uL — ABNORMAL HIGH (ref 4.0–10.5)

## 2015-08-24 LAB — TYPE AND SCREEN
ABO/RH(D): O POS
Antibody Screen: NEGATIVE

## 2015-08-24 LAB — LIPASE, BLOOD: Lipase: 38 U/L (ref 11–51)

## 2015-08-24 LAB — AMYLASE: Amylase: 49 U/L (ref 28–100)

## 2015-08-24 LAB — ABO/RH: ABO/RH(D): O POS

## 2015-08-24 SURGERY — Surgical Case
Anesthesia: Regional

## 2015-08-24 SURGERY — Surgical Case
Anesthesia: Spinal

## 2015-08-24 MED ORDER — LACTATED RINGERS IV SOLN
INTRAVENOUS | Status: DC
  Administered 2015-08-24 – 2015-08-25 (×3): via INTRAVENOUS

## 2015-08-24 MED ORDER — NALOXONE HCL 0.4 MG/ML IJ SOLN: 0.4000 mg | INTRAMUSCULAR | Status: DC | PRN

## 2015-08-24 MED ORDER — CITRIC ACID-SODIUM CITRATE 334-500 MG/5ML PO SOLN
30.0000 mL | Freq: Once | ORAL | Status: AC
  Administered 2015-08-24: 30 mL via ORAL

## 2015-08-24 MED ORDER — CEFAZOLIN SODIUM-DEXTROSE 2-3 GM-% IV SOLR
INTRAVENOUS | Status: AC
  Administered 2015-08-24: 2 g via INTRAVENOUS
  Filled 2015-08-24: qty 50

## 2015-08-24 MED ORDER — MORPHINE SULFATE (PF) 0.5 MG/ML IJ SOLN
INTRAMUSCULAR | Status: AC
  Filled 2015-08-24: qty 10

## 2015-08-24 MED ORDER — IBUPROFEN 600 MG PO TABS: 600.0000 mg | ORAL_TABLET | Freq: Four times a day (QID) | ORAL | Status: DC | PRN

## 2015-08-24 MED ORDER — ZOLPIDEM TARTRATE 5 MG PO TABS: 5.0000 mg | ORAL_TABLET | Freq: Every evening | ORAL | Status: DC | PRN

## 2015-08-24 MED ORDER — NALBUPHINE HCL 10 MG/ML IJ SOLN: 5.0000 mg | Freq: Once | INTRAMUSCULAR | Status: DC | PRN

## 2015-08-24 MED ORDER — ONDANSETRON HCL 4 MG/2ML IJ SOLN: 4.0000 mg | Freq: Three times a day (TID) | INTRAMUSCULAR | Status: DC | PRN

## 2015-08-24 MED ORDER — NALBUPHINE HCL 10 MG/ML IJ SOLN: 5.0000 mg | INTRAMUSCULAR | Status: DC | PRN

## 2015-08-24 MED ORDER — MAGNESIUM SULFATE BOLUS VIA INFUSION
4.0000 g | Freq: Once | INTRAVENOUS | Status: AC
Start: 2015-08-24 — End: 2015-08-24
  Administered 2015-08-24: 4 g via INTRAVENOUS
  Filled 2015-08-24: qty 500

## 2015-08-24 MED ORDER — SIMETHICONE 80 MG PO CHEW
80.0000 mg | CHEWABLE_TABLET | Freq: Three times a day (TID) | ORAL | Status: DC
  Administered 2015-08-25 – 2015-08-26 (×6): 80 mg via ORAL
  Filled 2015-08-24 (×6): qty 1

## 2015-08-24 MED ORDER — PHENYLEPHRINE 8 MG IN D5W 100 ML (0.08MG/ML) PREMIX OPTIME
INJECTION | INTRAVENOUS | Status: AC
  Filled 2015-08-24: qty 100

## 2015-08-24 MED ORDER — FENTANYL CITRATE (PF) 100 MCG/2ML IJ SOLN
INTRAMUSCULAR | Status: DC | PRN
  Administered 2015-08-24: 20 ug via INTRATHECAL

## 2015-08-24 MED ORDER — ONDANSETRON HCL 4 MG/2ML IJ SOLN
INTRAMUSCULAR | Status: AC
  Filled 2015-08-24: qty 2

## 2015-08-24 MED ORDER — DIBUCAINE 1 % RE OINT: 1.0000 "application " | TOPICAL_OINTMENT | RECTAL | Status: DC | PRN

## 2015-08-24 MED ORDER — ONDANSETRON HCL 4 MG/2ML IJ SOLN
INTRAMUSCULAR | Status: DC | PRN
  Administered 2015-08-24: 4 mg via INTRAVENOUS

## 2015-08-24 MED ORDER — SCOPOLAMINE 1 MG/3DAYS TD PT72
MEDICATED_PATCH | TRANSDERMAL | Status: AC
  Filled 2015-08-24: qty 1

## 2015-08-24 MED ORDER — LACTATED RINGERS IV SOLN
INTRAVENOUS | Status: DC
  Administered 2015-08-24 (×2): via INTRAVENOUS

## 2015-08-24 MED ORDER — ACETAMINOPHEN 500 MG PO TABS
1000.0000 mg | ORAL_TABLET | Freq: Four times a day (QID) | ORAL | Status: AC
  Administered 2015-08-24 – 2015-08-25 (×4): 1000 mg via ORAL
  Filled 2015-08-24 (×4): qty 2

## 2015-08-24 MED ORDER — KETOROLAC TROMETHAMINE 30 MG/ML IJ SOLN: 30.0000 mg | Freq: Once | INTRAMUSCULAR | Status: DC | PRN

## 2015-08-24 MED ORDER — LANOLIN HYDROUS EX OINT: 1.0000 "application " | TOPICAL_OINTMENT | CUTANEOUS | Status: DC | PRN

## 2015-08-24 MED ORDER — FAMOTIDINE IN NACL 20-0.9 MG/50ML-% IV SOLN
20.0000 mg | Freq: Once | INTRAVENOUS | Status: AC
  Administered 2015-08-24: 20 mg via INTRAVENOUS

## 2015-08-24 MED ORDER — IBUPROFEN 600 MG PO TABS
600.0000 mg | ORAL_TABLET | Freq: Four times a day (QID) | ORAL | Status: DC
  Administered 2015-08-24 – 2015-08-27 (×11): 600 mg via ORAL
  Filled 2015-08-24 (×11): qty 1

## 2015-08-24 MED ORDER — KETOROLAC TROMETHAMINE 30 MG/ML IJ SOLN
30.0000 mg | Freq: Four times a day (QID) | INTRAMUSCULAR | Status: DC | PRN
  Administered 2015-08-24: 30 mg via INTRAMUSCULAR
  Filled 2015-08-24: qty 1

## 2015-08-24 MED ORDER — NALOXONE HCL 2 MG/2ML IJ SOSY: 1.0000 ug/kg/h | PREFILLED_SYRINGE | INTRAVENOUS | Status: DC | PRN

## 2015-08-24 MED ORDER — OXYCODONE-ACETAMINOPHEN 5-325 MG PO TABS
1.0000 | ORAL_TABLET | ORAL | Status: DC | PRN
  Administered 2015-08-26 (×3): 1 via ORAL
  Filled 2015-08-24 (×3): qty 1

## 2015-08-24 MED ORDER — MEPERIDINE HCL 25 MG/ML IJ SOLN: 6.2500 mg | INTRAMUSCULAR | Status: DC | PRN

## 2015-08-24 MED ORDER — OXYTOCIN 10 UNIT/ML IJ SOLN
INTRAMUSCULAR | Status: AC
  Filled 2015-08-24: qty 4

## 2015-08-24 MED ORDER — DIPHENHYDRAMINE HCL 50 MG/ML IJ SOLN: 12.5000 mg | INTRAMUSCULAR | Status: DC | PRN

## 2015-08-24 MED ORDER — FENTANYL CITRATE (PF) 100 MCG/2ML IJ SOLN
INTRAMUSCULAR | Status: AC
  Filled 2015-08-24: qty 2

## 2015-08-24 MED ORDER — MENTHOL 3 MG MT LOZG
1.0000 | LOZENGE | OROMUCOSAL | Status: DC | PRN
  Filled 2015-08-24: qty 9

## 2015-08-24 MED ORDER — SIMETHICONE 80 MG PO CHEW
80.0000 mg | CHEWABLE_TABLET | ORAL | Status: DC
  Administered 2015-08-24 – 2015-08-26 (×3): 80 mg via ORAL
  Filled 2015-08-24 (×3): qty 1

## 2015-08-24 MED ORDER — SCOPOLAMINE 1 MG/3DAYS TD PT72: 1.0000 | MEDICATED_PATCH | Freq: Once | TRANSDERMAL | Status: DC

## 2015-08-24 MED ORDER — TETANUS-DIPHTH-ACELL PERTUSSIS 5-2.5-18.5 LF-MCG/0.5 IM SUSP
0.5000 mL | Freq: Once | INTRAMUSCULAR | Status: DC
  Filled 2015-08-24: qty 0.5

## 2015-08-24 MED ORDER — FAMOTIDINE IN NACL 20-0.9 MG/50ML-% IV SOLN
INTRAVENOUS | Status: AC
  Filled 2015-08-24: qty 50

## 2015-08-24 MED ORDER — LABETALOL HCL 5 MG/ML IV SOLN
20.0000 mg | Freq: Once | INTRAVENOUS | Status: AC
  Administered 2015-08-24: 20 mg via INTRAVENOUS
  Filled 2015-08-24: qty 4

## 2015-08-24 MED ORDER — OXYTOCIN 10 UNIT/ML IJ SOLN
40.0000 [IU] | INTRAMUSCULAR | Status: DC | PRN
  Administered 2015-08-24: 40 [IU] via INTRAVENOUS

## 2015-08-24 MED ORDER — PRENATAL MULTIVITAMIN CH
1.0000 | ORAL_TABLET | Freq: Every day | ORAL | Status: DC
  Administered 2015-08-25 – 2015-08-27 (×3): 1 via ORAL
  Filled 2015-08-24 (×3): qty 1

## 2015-08-24 MED ORDER — MAGNESIUM SULFATE 50 % IJ SOLN
2.0000 g/h | INTRAVENOUS | Status: DC
  Administered 2015-08-24 – 2015-08-25 (×2): 2 g/h via INTRAVENOUS
  Filled 2015-08-24 (×2): qty 80

## 2015-08-24 MED ORDER — DIPHENHYDRAMINE HCL 25 MG PO CAPS: 25.0000 mg | ORAL_CAPSULE | ORAL | Status: DC | PRN

## 2015-08-24 MED ORDER — OXYTOCIN 40 UNITS IN LACTATED RINGERS INFUSION - SIMPLE MED: 62.5000 mL/h | INTRAVENOUS | Status: AC

## 2015-08-24 MED ORDER — LACTATED RINGERS IV SOLN
INTRAVENOUS | Status: DC | PRN
  Administered 2015-08-24: 17:00:00 via INTRAVENOUS

## 2015-08-24 MED ORDER — ACETAMINOPHEN 325 MG PO TABS: 650.0000 mg | ORAL_TABLET | ORAL | Status: DC | PRN

## 2015-08-24 MED ORDER — BUPIVACAINE IN DEXTROSE 0.75-8.25 % IT SOLN
INTRATHECAL | Status: DC | PRN
  Administered 2015-08-24: 12 mg via INTRATHECAL

## 2015-08-24 MED ORDER — WITCH HAZEL-GLYCERIN EX PADS: 1.0000 "application " | MEDICATED_PAD | CUTANEOUS | Status: DC | PRN

## 2015-08-24 MED ORDER — OXYCODONE-ACETAMINOPHEN 5-325 MG PO TABS: 2.0000 | ORAL_TABLET | ORAL | Status: DC | PRN

## 2015-08-24 MED ORDER — CITRIC ACID-SODIUM CITRATE 334-500 MG/5ML PO SOLN
ORAL | Status: AC
  Filled 2015-08-24: qty 15

## 2015-08-24 MED ORDER — MORPHINE SULFATE (PF) 0.5 MG/ML IJ SOLN
INTRAMUSCULAR | Status: DC | PRN
  Administered 2015-08-24: .2 mg via INTRATHECAL

## 2015-08-24 MED ORDER — SODIUM CHLORIDE 0.9 % IJ SOLN
3.0000 mL | INTRAMUSCULAR | Status: DC | PRN
  Administered 2015-08-26: 3 mL via INTRAVENOUS
  Filled 2015-08-24: qty 3

## 2015-08-24 MED ORDER — SENNOSIDES-DOCUSATE SODIUM 8.6-50 MG PO TABS
2.0000 | ORAL_TABLET | ORAL | Status: DC
  Administered 2015-08-24 – 2015-08-26 (×3): 2 via ORAL
  Filled 2015-08-24 (×3): qty 2

## 2015-08-24 MED ORDER — KETOROLAC TROMETHAMINE 30 MG/ML IJ SOLN
INTRAMUSCULAR | Status: AC
Start: 2015-08-24 — End: 2015-08-24
  Administered 2015-08-24: 30 mg via INTRAMUSCULAR
  Filled 2015-08-24: qty 1

## 2015-08-24 MED ORDER — CEFAZOLIN SODIUM-DEXTROSE 2-3 GM-% IV SOLR: 2.0000 g | INTRAVENOUS | Status: DC

## 2015-08-24 MED ORDER — HYDROMORPHONE HCL 1 MG/ML IJ SOLN: 0.2500 mg | INTRAMUSCULAR | Status: DC | PRN

## 2015-08-24 MED ORDER — SCOPOLAMINE 1 MG/3DAYS TD PT72
MEDICATED_PATCH | TRANSDERMAL | Status: DC | PRN
  Administered 2015-08-24: 1 via TRANSDERMAL

## 2015-08-24 MED ORDER — BUPIVACAINE LIPOSOME 1.3 % IJ SUSP
20.0000 mL | Freq: Once | INTRAMUSCULAR | Status: DC
  Filled 2015-08-24: qty 20

## 2015-08-24 MED ORDER — SIMETHICONE 80 MG PO CHEW: 80.0000 mg | CHEWABLE_TABLET | ORAL | Status: DC | PRN

## 2015-08-24 MED ORDER — BUPIVACAINE HCL (PF) 0.25 % IJ SOLN
INTRAMUSCULAR | Status: AC
  Filled 2015-08-24: qty 20

## 2015-08-24 MED ORDER — DIPHENHYDRAMINE HCL 25 MG PO CAPS: 25.0000 mg | ORAL_CAPSULE | Freq: Four times a day (QID) | ORAL | Status: DC | PRN

## 2015-08-24 MED ORDER — PROMETHAZINE HCL 25 MG/ML IJ SOLN: 6.2500 mg | INTRAMUSCULAR | Status: DC | PRN

## 2015-08-24 MED ORDER — KETOROLAC TROMETHAMINE 30 MG/ML IJ SOLN
30.0000 mg | Freq: Four times a day (QID) | INTRAMUSCULAR | Status: DC | PRN
  Filled 2015-08-24: qty 1

## 2015-08-24 SURGICAL SUPPLY — 36 items
CLAMP CORD UMBIL (MISCELLANEOUS) IMPLANT
CLOTH BEACON ORANGE TIMEOUT ST (SAFETY) ×3 IMPLANT
CONTAINER PREFILL 10% NBF 15ML (MISCELLANEOUS) IMPLANT
DRAPE SHEET LG 3/4 BI-LAMINATE (DRAPES) IMPLANT
DRSG OPSITE POSTOP 4X10 (GAUZE/BANDAGES/DRESSINGS) ×3 IMPLANT
DURAPREP 26ML APPLICATOR (WOUND CARE) ×3 IMPLANT
ELECT REM PT RETURN 9FT ADLT (ELECTROSURGICAL) ×3
ELECTRODE REM PT RTRN 9FT ADLT (ELECTROSURGICAL) ×1 IMPLANT
EXTRACTOR VACUUM M CUP 4 TUBE (SUCTIONS) IMPLANT
EXTRACTOR VACUUM M CUP 4' TUBE (SUCTIONS)
GLOVE BIO SURGEON STRL SZ7.5 (GLOVE) ×3 IMPLANT
GLOVE BIOGEL PI IND STRL 7.0 (GLOVE) ×1 IMPLANT
GLOVE BIOGEL PI INDICATOR 7.0 (GLOVE) ×2
GOWN STRL REUS W/TWL LRG LVL3 (GOWN DISPOSABLE) ×6 IMPLANT
KIT ABG SYR 3ML LUER SLIP (SYRINGE) IMPLANT
NDL HYPO 25X5/8 SAFETYGLIDE (NEEDLE) IMPLANT
NDL SPNL 20GX3.5 QUINCKE YW (NEEDLE) IMPLANT
NEEDLE HYPO 22GX1.5 SAFETY (NEEDLE) ×3 IMPLANT
NEEDLE HYPO 25X5/8 SAFETYGLIDE (NEEDLE) IMPLANT
NEEDLE SPNL 20GX3.5 QUINCKE YW (NEEDLE) IMPLANT
NS IRRIG 1000ML POUR BTL (IV SOLUTION) ×3 IMPLANT
PACK C SECTION WH (CUSTOM PROCEDURE TRAY) ×3 IMPLANT
PENCIL SMOKE EVAC W/HOLSTER (ELECTROSURGICAL) ×3 IMPLANT
SUT MNCRL 0 VIOLET CTX 36 (SUTURE) ×2 IMPLANT
SUT MNCRL AB 3-0 PS2 27 (SUTURE) IMPLANT
SUT MON AB 2-0 CT1 27 (SUTURE) ×3 IMPLANT
SUT MON AB-0 CT1 36 (SUTURE) ×6 IMPLANT
SUT MONOCRYL 0 CTX 36 (SUTURE) ×4
SUT PLAIN 0 NONE (SUTURE) IMPLANT
SUT PLAIN 2 0 (SUTURE)
SUT PLAIN 2 0 XLH (SUTURE) IMPLANT
SUT PLAIN ABS 2-0 CT1 27XMFL (SUTURE) IMPLANT
SYR 20CC LL (SYRINGE) IMPLANT
SYR CONTROL 10ML LL (SYRINGE) ×3 IMPLANT
TOWEL OR 17X24 6PK STRL BLUE (TOWEL DISPOSABLE) ×3 IMPLANT
TRAY FOLEY CATH SILVER 14FR (SET/KITS/TRAYS/PACK) ×3 IMPLANT

## 2015-08-24 NOTE — Anesthesia Procedure Notes (Signed)
Spinal Patient location during procedure: OR Staffing Anesthesiologist: Leilani AbleHATCHETT, Leoni Goodness Performed by: anesthesiologist  Preanesthetic Checklist Completed: patient identified, surgical consent, pre-op evaluation, timeout performed, IV checked, risks and benefits discussed and monitors and equipment checked Spinal Block Patient position: sitting Prep: site prepped and draped and DuraPrep Patient monitoring: heart rate, cardiac monitor, continuous pulse ox and blood pressure Approach: midline Location: L3-4 Injection technique: single-shot Needle Needle type: Pencan  Needle gauge: 24 G Needle length: 9 cm Needle insertion depth: 6 cm Assessment Sensory level: T4

## 2015-08-24 NOTE — Transfer of Care (Signed)
Immediate Anesthesia Transfer of Care Note  Patient: Connie Logan  Procedure(s) Performed: Procedure(s): CESAREAN SECTION (N/A)  Patient Location: PACU  Anesthesia Type:Spinal  Level of Consciousness: awake  Airway & Oxygen Therapy: Patient Spontanous Breathing  Post-op Assessment: Report given to RN and Post -op Vital signs reviewed and stable  Post vital signs: stable  Last Vitals:  Filed Vitals:   08/24/15 1732 08/24/15 1733  BP:    Pulse:  96  Temp:  36.3 C  Resp: 14 12    Complications: No apparent anesthesia complications

## 2015-08-24 NOTE — MAU Provider Note (Signed)
Patient seen and examined. Consent witnessed and signed. No changes noted. Update completed. 

## 2015-08-24 NOTE — H&P (Signed)
  OB ADMISSION/ HISTORY & PHYSICAL:  Admission Date: 08/24/2015  2:01 PM  Admit Diagnosis: 37.1 weeks / severe preeclampsia / epigastric pain  Connie Logan is a 34 y.o. female presenting for epigastric pain and elevated BP.  Prenatal History: G3P0020   EDC : 09/13/2015, by Other Basis  [facility]  Primary Ob Provider: Taavon  Prenatal Labs: ABO, Rh: --/--/O POS (11/26 1504) Antibody: NEG (11/26 1504) Rubella:   Immune RPR:   NR HBsAg:   Negative HIV:   NR GTT: NL  Medical / Surgical History :  Past medical history:  Past Medical History  Diagnosis Date  . Kidney stones   . Headache     migraines  . GERD (gastroesophageal reflux disease)      Past surgical history:  Past Surgical History  Procedure Laterality Date  . Wisdom tooth extraction    . Robotic assisted diagnostic laparoscopy N/A 10/11/2014    Procedure: ROBOTIC ASSISTED DIAGNOSTIC LAPAROSCOPY EXCISION/ABLATION OF ENDOMETRIOSIS ;  Surgeon: Lenoard Adenichard J Taavon, MD;  Location: [facility];  Service: Gynecology;  Laterality: N/A;    Family History:  Family History  Problem Relation Age of Onset  . Hypertension Mother   . Cancer Mother   . Cancer Maternal Grandmother      Social History:  reports that she has never smoked. She has never used smokeless tobacco. She reports that she drinks alcohol. She reports that she does not use illicit drugs.   Allergies: Septra    Current Medications at time of admission:  Prior to Admission medications   Medication Sig Start Date End Date Taking? Authorizing Provider  acetaminophen (TYLENOL) 500 MG tablet Take 500 mg by mouth every 6 (six) hours as needed for mild pain.   Yes Historical Provider, MD   Review of Systems: Active FM No ctx or labor Severe epigastric pain - unable to lie back Nausea present / no vomiting No headache or vision changes  Physical Exam:  VS: Blood pressure 171/101, pulse 86, last menstrual period  11/02/2014, SpO2 100 %.  General: alert and oriented, appears very uncomfortable - in pain Heart: RRR Lungs: Clear lung fields Abdomen: Gravid, soft and non-tender, non-distended / uterus: gravid Extremities: 3+ edema  Genitalia / VE: Dilation: Fingertip Effacement (%): 50 Station: -3 Exam by:: Wiliam Ke. Florence Antonelli, CNM  FHR: baseline rate 135 / variability moderate / accelerations 10x10 / no decelerations TOCO: no  Assessment: [redacted]  weeks gestation Severe preeclampsia with severe BP range Epigastric pain  stage of labor FHR category 1 Unfavorable cervix and remote from delivery   Plan:  Admit Urgent cesarean delivery  Discussed with patient and spouse cesarean delivery - risks and benefits with severe preeclampsia. Risk for procedure reviewed including damage to nearby organs / bleeding / transfusion / infection / extended recovery time. Agrees and understands plan of care.   Marlinda MikeBAILEY, Bradleigh Sonnen CNM, MSN, [facility] 08/24/2015, 4:18 PM

## 2015-08-24 NOTE — Op Note (Signed)
Cesarean Section Procedure Note  Indications: severe preeclampsia  Pre-operative Diagnosis: 37 week 1 day pregnancy.  Post-operative Diagnosis: same  Surgeon: Lenoard AdenAAVON,Verdun Rackley J   Assistants: Fredric MareBailey, CNM  Anesthesia: Local anesthesia 0.25.% bupivacaine and Spinal anesthesia  ASA Class: 2  Procedure Details  The patient was seen in the Holding Room. The risks, benefits, complications, treatment options, and expected outcomes were discussed with the patient.  The patient concurred with the proposed plan, giving informed consent. The risks of anesthesia, infection, bleeding and possible injury to other organs discussed. Injury to bowel, bladder, or ureter with possible need for repair discussed. Possible need for transfusion with secondary risks of hepatitis or HIV acquisition discussed. Post operative complications to include but not limited to DVT, PE and Pneumonia noted. The site of surgery properly noted/marked. The patient was taken to Operating Room # 1, identified as Connie Logan and the procedure verified as C-Section Delivery. A Time Out was held and the above information confirmed.  After induction of anesthesia, the patient was draped and prepped in the usual sterile manner. A Pfannenstiel incision was made and carried down through the subcutaneous tissue to the fascia. Fascial incision was made and extended transversely using Mayo scissors. The fascia was separated from the underlying rectus tissue superiorly and inferiorly. The peritoneum was identified and entered. Peritoneal incision was extended longitudinally. The utero-vesical peritoneal reflection was incised transversely and the bladder flap was bluntly freed from the lower uterine segment. A low transverse uterine incision(Kerr hysterotomy) was made. Delivered from OA presentation was a  female with Apgar scores of 9 at one minute and 9 at five minutes. Bulb suctioning gently performed. Neonatal team in attendance.After the  umbilical cord was clamped and cut cord blood was obtained for evaluation. The placenta was removed intact and appeared normal. The uterus was curetted with a dry lap pack. Good hemostasis was noted.The uterine outline, tubes and ovaries appeared normal. The uterine incision was closed with running locked sutures of 0 Monocryl x 2 layers. Hemostasis was observed. The parietal peritoneum was closed with a running 2-0 Monocryl suture. The fascia was then reapproximated with running sutures of 0 Monocryl. The skin was reapproximated with 3-0 monocryl after [place] closure with 2-0 plain.  Instrument, sponge, and needle counts were correct prior the abdominal closure and at the conclusion of the case.   Findings: FTLF, OA, anterior placenta  Estimated Blood Loss:  300 mL         Drains: foley                 Specimens: placenta to path                 Complications:  None; patient tolerated the procedure well.         Disposition: PACU - hemodynamically stable.         Condition: stable  Attending Attestation: I performed the procedure.

## 2015-08-24 NOTE — MAU Provider Note (Signed)
History     CSN: [id number]  Arrival date and time: 08/24/15 1401 Provider on unit @ 1450  HPI Epigastric pain since early AM - worsening throughout the day Nausea / no vomiting Headache / no vision changes Last ate noon  Past Medical History  Diagnosis Date  . Kidney stones   . Headache     migraines  . GERD (gastroesophageal reflux disease)    Past Surgical History  Procedure Laterality Date  . Wisdom tooth extraction    . Robotic assisted diagnostic laparoscopy N/A 10/11/2014    Procedure: ROBOTIC ASSISTED DIAGNOSTIC LAPAROSCOPY EXCISION/ABLATION OF ENDOMETRIOSIS ;  Surgeon: Lovenia Kim, MD;  Location: [place] [facility];  Service: Gynecology;  Laterality: N/A;   Family History  Problem Relation Age of Onset  . Hypertension Mother   . Cancer Mother   . Cancer Maternal Grandmother    Social History  Substance Use Topics  . Smoking status: Never Smoker   . Smokeless tobacco: Never Used  . Alcohol Use: Yes     Comment: occ   Allergies:  Allergies  Allergen Reactions  . Septra [Sulfamethoxazole-Trimethoprim] Rash    Prescriptions prior to admission  Medication Sig Dispense Refill Last Dose  . acetaminophen (TYLENOL) 500 MG tablet Take 500 mg by mouth every 6 (six) hours as needed for mild pain.     Marland Kitchen azithromycin (ZITHROMAX) 250 MG tablet Take 2 po first day and then one po qd x 4 days 6 tablet 0   . Famotidine (PEPCID PO) Take 1 tablet by mouth daily as needed (indigestion).   10/11/2014 at Unknown time  . ibuprofen (ADVIL,MOTRIN) 800 MG tablet Take 800 mg by mouth every 8 (eight) hours as needed for moderate pain.      ROS  Nausea Upper abdominal pain / epigastric pain Pain radiating into back - sharp like stabbing knife Worsening edema Questionable contractions  Physical Exam   Blood pressure 174/106, pulse 88, last menstrual period 11/02/2014, SpO2 100 %.   BP: 185/110 - 174/106 - 174/105 - 172/97 - 166/87 - 171/95 - 169/95 - 171/101  Physical Exam  Alert  and oriented / acute discomfort upper abdomen / HOB elevated - sitting upright / anxious Abdomen soft and non-distended / + bowel sounds / tender upper abdomen Uterus gravid and non-tender Cervix: 0.5 cm / 50% / vtx -2 Extremities: 3+ edema / DTX 3+ no clonus  FHR 135/ moderate variability / + accels / no decels toco - no ctx  Results for Connie, Logan (MRN [id number]) as of 08/24/2015 16:05  Ref. Range 08/24/2015 14:01 08/24/2015 15:04  Sodium Latest Ref Range: 135-145 mmol/L  136  Potassium Latest Ref Range: 3.5-5.1 mmol/L  4.0  Chloride Latest Ref Range: 101-111 mmol/L  107  CO2 Latest Ref Range: 22-32 mmol/L  21 (L)  BUN Latest Ref Range: 6-20 mg/dL  11  Creatinine Latest Ref Range: 0.44-1.00 mg/dL  0.68  Calcium Latest Ref Range: 8.9-10.3 mg/dL  8.1 (L)  EGFR (Non-African Amer.) Latest Ref Range: >60 mL/min  >60  EGFR (African American) Latest Ref Range: >60 mL/min  >60  Glucose Latest Ref Range: 65-99 mg/dL  80  Anion gap Latest Ref Range: 5-15   8  Alkaline Phosphatase Latest Ref Range: 38-126 U/L  131 (H)  Albumin Latest Ref Range: 3.5-5.0 g/dL  2.5 (L)  Uric Acid, Serum Latest Ref Range: 2.3-6.6 mg/dL  6.0  AST Latest Ref Range: 15-41 U/L  71 (H)  ALT Latest Ref Range: 14-54 U/L  58 (H)  Total Protein Latest Ref Range: 6.5-8.1 g/dL  5.6 (L)  Total Bilirubin Latest Ref Range: 0.3-1.2 mg/dL  0.6  WBC Latest Ref Range: 4.0-10.5 K/uL  19.1 (H)  RBC Latest Ref Range: 3.87-5.11 MIL/uL  3.70 (L)  Hemoglobin Latest Ref Range: 12.0-15.0 g/dL  10.2 (L)  HCT Latest Ref Range: 36.0-46.0 %  30.7 (L)  MCV Latest Ref Range: 78.0-100.0 fL  83.0  MCH Latest Ref Range: 26.0-34.0 pg  27.6  MCHC Latest Ref Range: 30.0-36.0 g/dL  33.2  RDW Latest Ref Range: 11.5-15.5 %  14.0  Platelets Latest Ref Range: 150-400 K/uL  223  Sample Expiration Unknown  08/27/2015  Antibody Screen Unknown  NEG  ABO/RH(D) Unknown  O POS  Appearance Latest Ref Range: CLEAR  HAZY (A)   Bacteria, UA Latest Ref  Range: NONE SEEN  FEW (A)   Bilirubin Urine Latest Ref Range: NEGATIVE  NEGATIVE   Color, Urine Latest Ref Range: YELLOW  STRAW (A)   Glucose Latest Ref Range: NEGATIVE mg/dL NEGATIVE   Hgb urine dipstick Latest Ref Range: NEGATIVE  SMALL (A)   Ketones, ur Latest Ref Range: NEGATIVE mg/dL NEGATIVE   Leukocytes, UA Latest Ref Range: NEGATIVE  SMALL (A)   Nitrite Latest Ref Range: NEGATIVE  NEGATIVE   pH Latest Ref Range: 5.0-8.0  6.5   Protein Latest Ref Range: NEGATIVE mg/dL 100 (A)   RBC / HPF Latest Ref Range: 0-5 RBC/hpf 0-5   Specific Gravity, Urine Latest Ref Range: 1.005-1.030  1.010   Squamous Epithelial / LPF Latest Ref Range: NONE SEEN  6-30 (A)   WBC, UA Latest Ref Range: 0-5 WBC/hpf 0-5     MAU Course  Procedures  Assessment and Plan  37.1 weeks with epigastric pain and severe hypertension Consult Dr Ronita Hipps - labs / IV labetalol 63m now  BArtelia LarocheCNM. [facility]11/26/2016 @ 1500   Update / reassessment @ 1600 Epigastric pain persists - sitting HOB up in bed & moaning with pain No headache or vision changes Nausea but no vomiting FHR 140 - moderate variability / 10x10 accels / no decels IV labetalol 235mgiven - BP remain elevated / labs pending  Consistent with severe pre-eclampsia with partial HELLP - platelet stable Pre-op for cesarean section now

## 2015-08-24 NOTE — Anesthesia Preprocedure Evaluation (Signed)
Anesthesia Evaluation  Patient identified by MRN, date of birth, ID band Patient awake    Reviewed: Allergy & Precautions, H&P , NPO status , Patient's Chart, lab work & pertinent test results  Airway Mallampati: II  TM Distance: >3 FB Neck ROM: full    Dental no notable dental hx.    Pulmonary neg pulmonary ROS,    Pulmonary exam normal        Cardiovascular hypertension, Normal cardiovascular exam     Neuro/Psych negative psych ROS   GI/Hepatic Neg liver ROS,   Endo/Other  negative endocrine ROS  Renal/GU negative Renal ROS     Musculoskeletal   Abdominal (+) + obese,   Peds  Hematology negative hematology ROS (+)   Anesthesia Other Findings   Reproductive/Obstetrics (+) Pregnancy                             Anesthesia Physical Anesthesia Plan  ASA: III and emergent  Anesthesia Plan: Spinal   Post-op Pain Management:    Induction:   Airway Management Planned:   Additional Equipment:   Intra-op Plan:   Post-operative Plan:   Informed Consent: I have reviewed the patients History and Physical, chart, labs and discussed the procedure including the risks, benefits and alternatives for the proposed anesthesia with the patient or authorized representative who has indicated his/her understanding and acceptance.     Plan Discussed with: CRNA and Surgeon  Anesthesia Plan Comments:         Anesthesia Quick Evaluation

## 2015-08-24 NOTE — Anesthesia Postprocedure Evaluation (Signed)
Anesthesia Post Note  Patient: Connie Logan  Procedure(s) Performed: Procedure(s) (LRB): CESAREAN SECTION (N/A)  Patient location during evaluation: PACU Anesthesia Type: Spinal Level of consciousness: awake Pain management: pain level controlled Vital Signs Assessment: post-procedure vital signs reviewed and stable Respiratory status: spontaneous breathing Cardiovascular status: stable Postop Assessment: No headache, No backache, Spinal receding and Patient able to bend at knees Anesthetic complications: no    Last Vitals:  Filed Vitals:   08/24/15 1820 08/24/15 1830  BP: 122/81 127/86  Pulse: 83 88  Temp:    Resp: 20 18    Last Pain:  Filed Vitals:   08/24/15 1832  PainSc: 0-No pain                 Kathelyn Gombos JR,JOHN Kinzlie Harney

## 2015-08-24 NOTE — MAU Note (Signed)
Pt states that she woke up at 0300 unable to catch her breath and said she walked around and it got better.  It got worse this afternoon to where she cannot catch her breath, swollen lower extremities.  Lower back pain and abdominal cramping no leaking or bleeding.  Pain under her breasts.

## 2015-08-25 LAB — COMPREHENSIVE METABOLIC PANEL
ALT: 69 U/L — AB (ref 14–54)
AST: 82 U/L — ABNORMAL HIGH (ref 15–41)
Albumin: 2 g/dL — ABNORMAL LOW (ref 3.5–5.0)
Alkaline Phosphatase: 113 U/L (ref 38–126)
Anion gap: 5 (ref 5–15)
BUN: 12 mg/dL (ref 6–20)
CHLORIDE: 103 mmol/L (ref 101–111)
CO2: 25 mmol/L (ref 22–32)
CREATININE: 0.74 mg/dL (ref 0.44–1.00)
Calcium: 6.8 mg/dL — ABNORMAL LOW (ref 8.9–10.3)
GFR calc non Af Amer: >60 mL/min (ref >60)
Glucose, Bld: 93 mg/dL (ref 65–99)
POTASSIUM: 4.1 mmol/L (ref 3.5–5.1)
SODIUM: 133 mmol/L — AB (ref 135–145)
Total Bilirubin: 0.5 mg/dL (ref 0.3–1.2)
Total Protein: 5 g/dL — ABNORMAL LOW (ref 6.5–8.1)

## 2015-08-25 LAB — CBC
HCT: 25.4 % — ABNORMAL LOW (ref 36.0–46.0)
Hemoglobin: 8.3 g/dL — ABNORMAL LOW (ref 12.0–15.0)
MCH: 27.1 pg (ref 26.0–34.0)
MCHC: 32.7 g/dL (ref 30.0–36.0)
MCV: 83 fL (ref 78.0–100.0)
PLATELETS: 134 10*3/uL — AB (ref 150–400)
RBC: 3.06 MIL/uL — AB (ref 3.87–5.11)
RDW: 13.8 % (ref 11.5–15.5)
WBC: 17.7 10*3/uL — AB (ref 4.0–10.5)

## 2015-08-25 MED ORDER — HYDROCHLOROTHIAZIDE 25 MG PO TABS
25.0000 mg | ORAL_TABLET | Freq: Every day | ORAL | Status: DC
  Administered 2015-08-25 – 2015-08-27 (×3): 25 mg via ORAL
  Filled 2015-08-25 (×3): qty 1

## 2015-08-25 NOTE — Lactation Note (Signed)
This note was copied from the chart of Connie Logan. Lactation Consultation Note  Patient Name: Connie Logan UJWJX'BToday's Date: 08/25/2015 Reason for consult: Initial assessment;Infant < 6lbs;Late preterm infant;Difficult latch  Initial visit with Mom and FOB, baby 5218 hrs old.  Baby delivered by c-section at 5286w1d weighing 5 lbs. 0.6 oz.  Mom on MgSO4 and feeling very sleepy.  Baby has been to the breast 7-8 times for feeding attempts.  Baby latches shallow onto base of nipple and falls asleep.  Explained to parents that this was normal for a baby born early, and small in size.  Encouraged skin to skin, and feeding attempts at least every 3 hrs, sooner if she cues, but to double pump regularly (8+ per 24 hrs), and supplement with colostrum adding formula as needed to equal 5-10 ml as day progresses.  Baby took 4 ml from recent pumping by curved tip syringe while suckling on Mom's finger.  Mom aware of need for formula if unable to express enough colostrum.   Brochure gave to Mom.  Explained IP and OP lactation services available to her.   Consult Status Consult Status: Follow-up Date: 08/26/15 Follow-up type: In-patient    Judee ClaraSmith, Camaron Cammack E 08/25/2015, 11:34 AM

## 2015-08-25 NOTE — Lactation Note (Signed)
This note was copied from the chart of Connie Lacretia Nicksesh Berberian. Lactation Consultation Note Follow up visit at 27 hours of age.  Baby has had a few syringe feedings of ebm 424ml+3ml+2ml and few attempts at the breast that baby was too sleepy for.  Mom is concerned baby has been sleepy for feedings.  Due to size and 37W, Lc encouraged mom to continue to offer the breast and pump 8 times in 24 hours.  Mom to give by EBM and add supplemental formula as needed to get enough mls per hours of age needed.  LC assisted with FOB finger feeding with syringe 12mls of formula.  Baby tolerated well and burped after.  Instructed on use of syringe at the breast for feedings if they prefer.  Baby is not eager and not very coordinated with gloved finger suck training.  Encouraged parents to keep working with baby.  Mom to call for assist as needed.     Patient Name: Connie Lacretia Nicksesh Wauters WUJWJ'XToday's Date: 08/25/2015 Reason for consult: Follow-up assessment;Infant < 6lbs   Maternal Data    Feeding    LATCH Score/Interventions                      Lactation Tools Discussed/Used     Consult Status Consult Status: Follow-up    Birdella Sippel, Arvella MerlesJana Lynn 08/25/2015, 9:29 PM

## 2015-08-25 NOTE — Progress Notes (Signed)
Patient ID: Margorie Johnesh M Boyland, female   DOB: 09/30/1980, 34 y.o.   MRN: [id number] Subjective: No complaints Epigastric pain resolved No CP or SOB  Objective: Vital signs in last 24 hours: Temp:  [97.3 F (36.3 C)-97.8 F (36.6 C)] 97.8 F (36.6 C) (11/27 0802) Pulse Rate:  [69-98] 76 (11/27 0957) Resp:  [12-24] 18 (11/27 1102) BP: (101-185)/(68-107) 142/91 mmHg (11/27 0957) SpO2:  [93 %-100 %] 98 % (11/27 0957) Weight:  [196 lb 11.2 oz (89.223 kg)] 196 lb 11.2 oz (89.223 kg) (11/27 0500) Weight change:  Last BM Date: 08/24/15  Intake/Output from previous day: 11/26 0701 - 11/27 0700 In: 4792.6 [P.O.:1437; I.V.:3355.6] Out: 2220 [Urine:1620; Blood:600] Intake/Output this shift: Total I/O In: 830 [P.O.:480; I.V.:350] Out: 710 [Urine:710]  General appearance: alert, cooperative and appears stated age Neck: no adenopathy, no carotid bruit, no JVD, supple, symmetrical, trachea midline and thyroid not enlarged, symmetric, no tenderness/mass/nodules Resp: clear to auscultation bilaterally and normal percussion bilaterally Chest wall: no tenderness Cardio: regular rate and rhythm, S1, S2 normal, no murmur, click, rub or gallop GI: soft, non-tender; bowel sounds normal; no masses,  no organomegaly Extremities: extremities normal, atraumatic, no cyanosis or edema and Homans sign is negative, no sign of DVT Neurologic: Alert and oriented X 3, normal strength and tone. Normal symmetric reflexes. Normal coordination and gait Incision/Wound:  Lab Results:  Recent Labs  08/24/15 1504 08/25/15 0610  WBC 19.1* 17.7*  HGB 10.2* 8.3*  HCT 30.7* 25.4*  PLT 223 134*   BMET  Recent Labs  08/24/15 1504 08/25/15 0610  NA 136 133*  K 4.0 4.1  CL 107 103  CO2 21* 25  GLUCOSE 80 93  BUN 11 12  CREATININE 0.68 0.74  CALCIUM 8.1* 6.8*    Studies/Results: No results found.  Medications: I have reviewed the patient's current medications.  Assessment/Plan: POD #1 s/p csection for  Severe PEC Atypical epigastric pain resolved No evidence of diuresis Will continue MgSO4 at this time Add HcTZ Rpt labs in am    LOS: 1 day

## 2015-08-26 LAB — CBC
HEMATOCRIT: 27.5 % — AB (ref 36.0–46.0)
Hemoglobin: 9 g/dL — ABNORMAL LOW (ref 12.0–15.0)
MCH: 27.6 pg (ref 26.0–34.0)
MCHC: 32.7 g/dL (ref 30.0–36.0)
MCV: 84.4 fL (ref 78.0–100.0)
Platelets: 157 10*3/uL (ref 150–400)
RBC: 3.26 MIL/uL — ABNORMAL LOW (ref 3.87–5.11)
RDW: 14.4 % (ref 11.5–15.5)
WBC: 20.7 10*3/uL — ABNORMAL HIGH (ref 4.0–10.5)

## 2015-08-26 LAB — COMPREHENSIVE METABOLIC PANEL
ALBUMIN: 2 g/dL — AB (ref 3.5–5.0)
ALK PHOS: 123 U/L (ref 38–126)
ALT: 48 U/L (ref 14–54)
ANION GAP: 6 (ref 5–15)
AST: 40 U/L (ref 15–41)
BILIRUBIN TOTAL: 0.4 mg/dL (ref 0.3–1.2)
BUN: 13 mg/dL (ref 6–20)
CALCIUM: 6.3 mg/dL — AB (ref 8.9–10.3)
CO2: 28 mmol/L (ref 22–32)
Chloride: 101 mmol/L (ref 101–111)
Creatinine, Ser: 0.76 mg/dL (ref 0.44–1.00)
GFR calc non Af Amer: >60 mL/min (ref >60)
Glucose, Bld: 81 mg/dL (ref 65–99)
POTASSIUM: 4.2 mmol/L (ref 3.5–5.1)
Sodium: 135 mmol/L (ref 135–145)
TOTAL PROTEIN: 4.8 g/dL — AB (ref 6.5–8.1)

## 2015-08-26 MED ORDER — LABETALOL HCL 100 MG PO TABS
100.0000 mg | ORAL_TABLET | Freq: Two times a day (BID) | ORAL | Status: DC
  Administered 2015-08-26 – 2015-08-27 (×3): 100 mg via ORAL
  Filled 2015-08-26 (×3): qty 1

## 2015-08-26 MED ORDER — SODIUM CHLORIDE 0.9 % IJ SOLN
3.0000 mL | Freq: Two times a day (BID) | INTRAMUSCULAR | Status: DC
  Administered 2015-08-26: 3 mL via INTRAVENOUS

## 2015-08-26 NOTE — Progress Notes (Signed)
POD # 2  Subjective: Pt reports feeling well/ Pain controlled with Motrin Tolerating po/Voiding without problems/ No n/v/ Flatus present No SOB or CP Some blurred vision since put contacts in-plans to remove and use glasses No HA or epigastric pain Activity: OOB with assist Bleeding is light Newborn info:  Information for the patient's newborn:  Neomia GlassJohnson, Girl Kimble [[id number]][[id number]]  female   Feeding: breast and bottle  Objective: VS:  BP: 122/68, 143/92, 151/93, 148/92 Filed Vitals:   08/26/15 0839 08/26/15 0840 08/26/15 0841 08/26/15 0842  BP:      Pulse: 69 65 73 70  Temp:      TempSrc:      Resp: 13 14 17 19   Height:      Weight:      SpO2: 99% 99% 99% 100%    I&O: Intake/Output      11/27 0701 - 11/28 0700 11/28 0701 - 11/29 0700   P.O. 2160    I.V. (mL/kg) 2837.5 (35.6)    Total Intake(mL/kg) 4997.5 (62.7)    Urine (mL/kg/hr) 7060 (3.7)    Blood     Total Output 7060     Net -2062.5            LABS:  Recent Labs  08/25/15 0610 08/26/15 0535  WBC 17.7* 20.7*  HGB 8.3* 9.0*  HCT 25.4* 27.5*  PLT 134* 157    Blood type: --/--/O POS, O POS (11/26 1504) Rubella:   Immune   Physical Exam:  General: alert, cooperative and no distress CV: Regular rate and rhythm Resp: CTA bilaterally Abdomen: soft, nontender, normal bowel sounds Uterine Fundus: firm, below umbilicus, nontender Incision: Covered with Tegaderm and honeycomb dressing; no significant drainage, edema, bruising, or erythema; well approximated with suture Lochia: minimal Ext: edema 2+ BLE and Homans sign is negative, no sign of DVT   Assessment/: POD # 2/ G3P1021/ S/P C/Section d/t PEC  Preeclampsia, delivered Doing well  Plan: Good diuresis, asymptomatic, LFTs improving but elevated BPs MgSO4 and foley recently d/c'd Start Labetalol today Continue routine post op orders Anticipate discharge home tomorrow   Signed: Donette LarryBHAMBRI, Tamar Miano, Dorris CarnesN, MSN, CNM 08/26/2015, 9:11 AM

## 2015-08-26 NOTE — Progress Notes (Signed)
Patient ID: Connie Logan, female   DOB: 10/26/1980, 34 y.o.   MRN: [id number] Excellent diuresis No HA or visual changes per RN Epigastric pain resolved BP 148/92 mmHg  Pulse 82  Temp(Src) 98.2 F (36.8 C) (Oral)  Resp 18  Ht 5\' 3"  (1.6 m)  Wt 79.652 kg (175 lb 9.6 oz)  BMI 31.11 kg/m2  SpO2 97%  LMP 11/02/2014  Breastfeeding? Unknown  CBC    Component Value Date/Time   WBC 20.7* 08/26/2015 0535   WBC 5.2 10/02/2014 0843   RBC 3.26* 08/26/2015 0535   RBC 4.49 10/02/2014 0843   HGB 9.0* 08/26/2015 0535   HCT 27.5* 08/26/2015 0535   PLT 157 08/26/2015 0535   MCV 84.4 08/26/2015 0535   MCH 27.6 08/26/2015 0535   MCH 30.1 10/02/2014 0843   MCHC 32.7 08/26/2015 0535   MCHC 34.8 10/02/2014 0843   RDW 14.4 08/26/2015 0535   RDW 13.1 10/02/2014 0843   LYMPHSABS 2.3 10/02/2014 0843   EOSABS 0.1 10/02/2014 0843   BASOSABS 0.0 10/02/2014 0843    CMP     Component Value Date/Time   NA 135 08/26/2015 0535   NA 141 10/02/2014 0843   K 4.2 08/26/2015 0535   CL 101 08/26/2015 0535   CO2 28 08/26/2015 0535   GLUCOSE 81 08/26/2015 0535   GLUCOSE 84 10/02/2014 0843   BUN 13 08/26/2015 0535   BUN 9 10/02/2014 0843   CREATININE 0.76 08/26/2015 0535   CALCIUM 6.3* 08/26/2015 0535   PROT 4.8* 08/26/2015 0535   ALBUMIN 2.0* 08/26/2015 0535   AST 40 08/26/2015 0535   ALT 48 08/26/2015 0535   ALKPHOS 123 08/26/2015 0535   BILITOT 0.4 08/26/2015 0535   GFRNONAA >60 08/26/2015 0535   GFRAA >60 08/26/2015 0535   History of Severe PEC now with nl labs , good diuresis but persistent HTN Wiil dc MgSO4 and Foley Start Labetalol 100mg  bid

## 2015-08-26 NOTE — Lactation Note (Signed)
This note was copied from the chart of Connie Lacretia Nicksesh Chittenden. Lactation Consultation Note  Patient Name: Connie Logan AVWUJ'WToday's Date: 08/26/2015 Reason for consult: Follow-up assessment;NICU baby Baby 45 hours old. Parents report that baby keeps going to breast, suckling a few times, and then falling asleep. FOB has been finger and syringe feeding. Attempted to latch baby to breast with same result. Fitted mom with a #20 NS and used syringe to fill NS. Baby latched deeply to right breast in football position suckling rhythmically with intermittent swallows. Baby able to transfer formula through shield, and willing to suckle even after shield empty. Baby able to transfer about 7 ml of formula while this LC in the room. Parents still supplementing additional formula according to supplementation guidelines which were given with review.   Enc parents to complete this BF with curve-tipped syringe and NS, but then demonstrated how to use purple 5 french feeding tube and syringe system for following feedings. FOB very helpful with BF assistance. Mom reports that she is pumping every 2-3 hours, but she is not seeing anything yet. Enc mom to continue pumping 8 times/24 hours followed by hand expression. Discussed normal progression of milk coming to volume.   Plan is for parents to put baby to breast with cues and by 3 hours using NS and supplementing EBM/formula according to guidelines using SNS. Enc mom to postpump for 15 minutes followed by hand expression. Enc parents to call for assistance with nursing as needed.   Maternal Data    Feeding Feeding Type: Formula  LATCH Score/Interventions Latch: Grasps breast easily, tongue down, lips flanged, rhythmical sucking. Intervention(s): Skin to skin Intervention(s): Adjust position  Audible Swallowing: Spontaneous and intermittent Intervention(s): Skin to skin  Type of Nipple: Everted at rest and after stimulation (Short shaft.)  Comfort  (Breast/Nipple): Soft / non-tender     Hold (Positioning): Assistance needed to correctly position infant at breast and maintain latch. Intervention(s): Breastfeeding basics reviewed;Support Pillows;Position options;Skin to skin  LATCH Score: 9  Lactation Tools Discussed/Used Tools: Nipple Shields Nipple shield size: 20   Consult Status Consult Status: Follow-up Date: 08/27/15 Follow-up type: In-patient    Connie Logan, Connie Logan 08/26/2015, 2:40 PM

## 2015-08-27 ENCOUNTER — Encounter (HOSPITAL_COMMUNITY): Payer: Self-pay | Admitting: *Deleted

## 2015-08-27 DIAGNOSIS — O1205 Gestational edema, complicating the puerperium: Secondary | ICD-10-CM | POA: Clinically undetermined

## 2015-08-27 HISTORY — DX: Gestational edema, complicating the puerperium: O12.05

## 2015-08-27 MED ORDER — OXYCODONE-ACETAMINOPHEN 5-325 MG PO TABS
1.0000 | ORAL_TABLET | ORAL | Status: DC | PRN
Start: 2015-08-27 — End: 2016-03-10

## 2015-08-27 MED ORDER — LABETALOL HCL 100 MG PO TABS: 100.0000 mg | ORAL_TABLET | Freq: Two times a day (BID) | ORAL | Status: DC

## 2015-08-27 MED ORDER — IBUPROFEN 600 MG PO TABS: 600.0000 mg | ORAL_TABLET | Freq: Four times a day (QID) | ORAL | Status: DC

## 2015-08-27 MED ORDER — HYDROCHLOROTHIAZIDE 25 MG PO TABS: 25.0000 mg | ORAL_TABLET | Freq: Every day | ORAL | Status: DC

## 2015-08-27 NOTE — Lactation Note (Signed)
This note was copied from the chart of Connie Lacretia Nicksesh Beveridge. Lactation Consultation Note: Infant will be made a baby patient. Infant is 37 .1 weeks at 64 hours. Parents advised to increase feeding volume.  Reviewed LPI parent instructions with increased guidelines for increased volume with feedings every 2-3 hours.  Parents are very receptive to all teaching. SNS sat up and infant took 19 ml of formula well. Taught mother to do tea cup hold to latch infant using SNS. Infant sustained latch for 25 mins. Assist with finger feeding using a curved tip syringe to give an additional 6 ml of formula for a total of 25 ml for this feeding. Mother advised to post pump after each feeding. Discussed breast massage and ways to firm nipple with fingers or hand pump. Mother are aware that they can page staff nurse or [facility] for assistance. Advised mother to continue to feed infant at least 8-12 times in 24 hours. Mother taught cross cradle position using firm support. Mother very dependent on assistance from father d/t increase pain from C-Section.   Patient Name: Connie Logan ZOXWR'UToday's Date: 08/27/2015 Reason for consult: Follow-up assessment   Maternal Data    Feeding Feeding Type: Formula  LATCH Score/Interventions                      Lactation Tools Discussed/Used Tools: Pump;Nipple Shields Nipple shield size: 20 Breast pump type: Double-Electric Breast Pump   Consult Status      Connie Logan, Connie Logan 08/27/2015, 9:38 AM

## 2015-08-27 NOTE — Discharge Summary (Signed)
POSTOPERATIVE DISCHARGE SUMMARY:  Patient ID: Connie Logan: [id number] DOB/AGE: 32/02/1981 32 y.o.  Admit date: 08/24/2015 Admission Diagnoses: Severe Preeclampsia   Discharge date: 08/27/2015 Discharge Diagnoses: S/P Primary C/S due to severe PEC on 08/27/2015  Prenatal history: G3P1021   ED[facility] : [facility]/13/2015[facility]  Primary provider : Dr. Billy Coastaavon Prenatal course complicated by none  Prenatal Labs: ABO, Rh: O POS (11/26 1504) Antibody: NEG (11/26 1504) Rubella:  Immune  RPR:  Non-Reactive HBsAg:  Non-Reactive  HIV:  Non-Reactive  GTT : Abnormal 1 hr GTT GBS:  Pending  Medical / Surgical History :  Past medical history:  Past Medical History  Diagnosis Date  . Kidney stones   . Headache     migraines  . GERD (gastroesophageal reflux disease)   . Gestational edema, postpartum 08/27/2015    Past surgical history:  Past Surgical History  Procedure Laterality Date  . Wisdom tooth extraction    . Robotic assisted diagnostic laparoscopy N/A 10/11/2014    Procedure: ROBOTIC ASSISTED DIAGNOSTIC LAPAROSCOPY EXCISION/ABLATION OF ENDOMETRIOSIS ;  Surgeon: Lenoard Adenichard J Taavon, MD;  Location: [facility];  Service: Gynecology;  Laterality: N/A;    Family History:  Family History  Problem Relation Age of Onset  . Hypertension Mother   . Cancer Mother   . Cancer Maternal Grandmother     Social History:  reports that she has never smoked. She has never used smokeless tobacco. She reports that she drinks alcohol. She reports that she does not use illicit drugs.  Allergies: Septra   Current Medications at time of admission:  Prior to Admission medications   Medication Sig Start Date End Date Taking? Authorizing Provider  acetaminophen (TYLENOL) 500 MG tablet Take 500 mg by mouth every 6 (six) hours as needed for mild pain.   Yes Historical Provider, MD  Calcium Carbonate Antacid (TUMS PO) Take 1 tablet by mouth daily.   Yes  Historical Provider, MD  azithromycin (ZITHROMAX) 250 MG tablet Take 2 po first day and then one po qd x 4 days Patient not taking: Reported on 08/24/2015 11/06/14   Deatra CanterWilliam J Oxford, FNP  hydrochlorothiazide (HYDRODIURIL) 25 MG tablet Take 1 tablet (25 mg total) by mouth daily. 08/27/15   Covey Baller Arita Missawson, CNM  ibuprofen (ADVIL,MOTRIN) 600 MG tablet Take 1 tablet (600 mg total) by mouth every 6 (six) hours. 08/27/15   Raelyn Moraolitta Aster Screws, CNM  labetalol (NORMODYNE) 100 MG tablet Take 1 tablet (100 mg total) by mouth 2 (two) times daily. 08/27/15   Raelyn Moraolitta Akram Kissick, CNM  oxyCODONE-acetaminophen (PERCOCET/ROXICET) 5-325 MG tablet Take 1 tablet by mouth every 4 (four) hours as needed (for pain scale 4-7). 08/27/15   Raelyn Moraolitta Ayven Glasco, CNM    Intrapartum Course:  Admitted for cesarean delivery d/t severe PEC   Procedures: Cesarean section delivery on 08/24/2015 with delivery of  viable newborn by Dr Billy Coastaavon   See operative report for further details APGAR (1 MIN): 8   APGAR (5 MINS): 8    Postoperative / postpartum course:  Uncomplicated with discharge on POD 5 Complicated by: Elevated BPs  Discharge Instructions:  Discharged Condition: good  Activity: pelvic rest and postoperative restrictions x 2   Diet: routine  Medications:    Medication List    TAKE these medications        acetaminophen 500 MG tablet  Commonly known as:  TYLENOL  Take 500 mg by mouth every 6 (six) hours as needed for mild pain.  azithromycin 250 MG tablet  Commonly known as:  ZITHROMAX  Take 2 po first day and then one po qd x 4 days     hydrochlorothiazide 25 MG tablet  Commonly known as:  HYDRODIURIL  Take 1 tablet (25 mg total) by mouth daily.     ibuprofen 600 MG tablet  Commonly known as:  ADVIL,MOTRIN  Take 1 tablet (600 mg total) by mouth every 6 (six) hours.     labetalol 100 MG tablet  Commonly known as:  NORMODYNE  Take 1 tablet (100 mg total) by mouth 2 (two) times daily.      oxyCODONE-acetaminophen 5-325 MG tablet  Commonly known as:  PERCOCET/ROXICET  Take 1 tablet by mouth every 4 (four) hours as needed (for pain scale 4-7).     TUMS PO  Take 1 tablet by mouth daily.        Wound Care: keep clean and dry / remove honeycomb POD 5 Postpartum Instructions: [facility] discharge booklet - instructions reviewed  Discharge to: Home  Follow up :  [facility] in 1 week for interval visit with CNM or Dr. Billy Coast for BP recheck [facility] in 6 weeks for routine postpartum visit with Dr. Billy Coast                Signed: Raelyn Mora, M MSN, CNM 08/27/2015, 10:37 AM

## 2015-08-27 NOTE — Progress Notes (Signed)
Discharge instructions complete

## 2015-08-27 NOTE — Discharge Instructions (Signed)
Preeclampsia and Eclampsia - signs and symptoms can still occur in postpartum period Preeclampsia is a serious condition that develops only during pregnancy. It is also called toxemia of pregnancy. This condition causes high blood pressure along with other symptoms, such as swelling and headaches. These may develop as the condition gets worse. Preeclampsia may occur 20 weeks or later into your pregnancy.  Diagnosing and treating preeclampsia early is very important. If not treated early, it can cause serious problems for you and your baby. One problem it can lead to is eclampsia, which is a condition that causes muscle jerking or shaking (convulsions) in the mother. Delivering your baby is the best treatment for preeclampsia or eclampsia.  RISK FACTORS The cause of preeclampsia is not known. You may be more likely to develop preeclampsia if you have certain risk factors. These include:   Being pregnant for the first time.  Having preeclampsia in a past pregnancy.  Having a family history of preeclampsia.  Having high blood pressure.  Being pregnant with twins or triplets.  Being 2835 or older.  Being African American.  Having kidney disease or diabetes.  Having medical conditions such as lupus or blood diseases.  Being very overweight (obese). SIGNS AND SYMPTOMS  The earliest signs of preeclampsia are:  High blood pressure.  Increased protein in your urine. Your health care provider will check for this at every prenatal visit. Other symptoms that can develop include:   Severe headaches.  Sudden weight gain.  Swelling of your hands, face, legs, and feet.  Feeling sick to your stomach (nauseous) and throwing up (vomiting).  Vision problems (blurred or double vision).  Numbness in your face, arms, legs, and feet.  Dizziness.  Slurred speech.  Sensitivity to bright lights.  Abdominal pain. DIAGNOSIS  There are no screening tests for preeclampsia. Your health care  provider will ask you about symptoms and check for signs of preeclampsia during your prenatal visits. You may also have tests, including:  Urine testing.  Blood testing.  Checking your baby's heart rate.  Checking the health of your baby and your placenta using images created with sound waves (ultrasound). TREATMENT  You can work out the best treatment approach together with your health care provider. It is very important to keep all prenatal appointments. If you have an increased risk of preeclampsia, you may need more frequent prenatal exams.  Your health care provider may prescribe bed rest.  You may have to eat as little salt as possible.  You may need to take medicine to lower your blood pressure if the condition does not respond to more conservative measures.  You may need to stay in the hospital if your condition is severe. There, treatment will focus on controlling your blood pressure and fluid retention. You may also need to take medicine to prevent seizures.  If the condition gets worse, your baby may need to be delivered early to protect you and the baby. You may have your labor started with medicine (be induced), or you may have a cesarean delivery.  Preeclampsia usually goes away after the baby is born. HOME CARE INSTRUCTIONS   Only take over-the-counter or prescription medicines as directed by your health care provider.  Lie on your left side while resting. This keeps pressure off your baby.  Elevate your feet while resting.  Get regular exercise. Ask your health care provider what type of exercise is safe for you.  Avoid caffeine and alcohol.  Do not smoke.  Drink 6-8  glasses of water every day.  Eat a balanced diet that is low in salt. Do not add salt to your food.  Avoid stressful situations as much as possible.  Get plenty of rest and sleep.  Keep all prenatal appointments and tests as scheduled. SEEK MEDICAL CARE IF:  You are gaining more weight than  expected.  You have any headaches, abdominal pain, or nausea.  You are bruising more than usual.  You feel dizzy or light-headed. SEEK IMMEDIATE MEDICAL CARE IF:   You develop sudden or severe swelling anywhere in your body. This usually happens in the legs.  You gain 5 lb (2.3 kg) or more in a week.  You have a severe headache, dizziness, problems with your vision, or confusion.  You have severe abdominal pain.  You have lasting nausea or vomiting.  You have a seizure.  You have trouble moving any part of your body.  You develop numbness in your body.  You have trouble speaking.  You have any abnormal bleeding.  You develop a stiff neck.  You pass out. MAKE SURE YOU:   Understand these instructions.  Will watch your condition.  Will get help right away if you are not doing well or get worse.   This information is not intended to replace advice given to you by your health care provider. Make sure you discuss any questions you have with your health care provider.   Document Released: 09/11/2000 Document Revised: 09/19/2013 Document Reviewed: 07/07/2013 Elsevier Interactive Patient Education 2016 [employer]. Postpartum Depression and Baby Blues The postpartum period begins right after the birth of a baby. During this time, there is often a great amount of joy and excitement. It is also a time of many changes in the life of the parents. Regardless of how many times a mother gives birth, each child brings new challenges and dynamics to the family. It is not unusual to have feelings of excitement along with confusing shifts in moods, emotions, and thoughts. All mothers are at risk of developing postpartum depression or the "baby blues." These mood changes can occur right after giving birth, or they may occur many months after giving birth. The baby blues or postpartum depression can be mild or severe. Additionally, postpartum depression can go away rather quickly, or it can  be a long-term condition.  CAUSES Raised hormone levels and the rapid drop in those levels are thought to be a main cause of postpartum depression and the baby blues. A number of hormones change during and after pregnancy. Estrogen and progesterone usually decrease right after the delivery of your baby. The levels of thyroid hormone and various cortisol steroids also rapidly drop. Other factors that play a role in these mood changes include major life events and genetics.  RISK FACTORS If you have any of the following risks for the baby blues or postpartum depression, know what symptoms to watch out for during the postpartum period. Risk factors that may increase the likelihood of getting the baby blues or postpartum depression include:  Having a personal or family history of depression.   Having depression while being pregnant.   Having premenstrual mood issues or mood issues related to oral contraceptives.  Having a lot of life stress.   Having marital conflict.   Lacking a social support network.   Having a baby with special needs.   Having health problems, such as diabetes.  SIGNS AND SYMPTOMS Symptoms of baby blues include:  Brief changes in mood, such as  going from extreme happiness to sadness.  Decreased concentration.   Difficulty sleeping.   Crying spells, tearfulness.   Irritability.   Anxiety.  Symptoms of postpartum depression typically begin within the first month after giving birth. These symptoms include:  Difficulty sleeping or excessive sleepiness.   Marked weight loss.   Agitation.   Feelings of worthlessness.   Lack of interest in activity or food.  Postpartum psychosis is a very serious condition and can be dangerous. Fortunately, it is rare. Displaying any of the following symptoms is cause for immediate medical attention. Symptoms of postpartum psychosis include:   Hallucinations and delusions.   Bizarre or disorganized  behavior.   Confusion or disorientation.  DIAGNOSIS  A diagnosis is made by an evaluation of your symptoms. There are no medical or lab tests that lead to a diagnosis, but there are various questionnaires that a health care provider may use to identify those with the baby blues, postpartum depression, or psychosis. Often, a screening tool called the [place] Postnatal Depression Scale is used to diagnose depression in the postpartum period.  TREATMENT The baby blues usually goes away on its own in 1-2 weeks. Social support is often all that is needed. You will be encouraged to get adequate sleep and rest. Occasionally, you may be given medicines to help you sleep.  Postpartum depression requires treatment because it can last several months or longer if it is not treated. Treatment may include individual or group therapy, medicine, or both to address any social, physiological, and psychological factors that may play a role in the depression. Regular exercise, a healthy diet, rest, and social support may also be strongly recommended.  Postpartum psychosis is more serious and needs treatment right away. Hospitalization is often needed. HOME CARE INSTRUCTIONS  Get as much rest as you can. Nap when the baby sleeps.   Exercise regularly. Some women find yoga and walking to be beneficial.   Eat a balanced and nourishing diet.   Do little things that you enjoy. Have a cup of tea, take a bubble bath, read your favorite magazine, or listen to your favorite music.  Avoid alcohol.   Ask for help with household chores, cooking, grocery shopping, or running errands as needed. Do not try to do everything.   Talk to people close to you about how you are feeling. Get support from your partner, family members, friends, or other new moms.  Try to stay positive in how you think. Think about the things you are grateful for.   Do not spend a lot of time alone.   Only take over-the-counter or  prescription medicine as directed by your health care provider.  Keep all your postpartum appointments.   Let your health care provider know if you have any concerns.  SEEK MEDICAL CARE IF: You are having a reaction to or problems with your medicine. SEEK IMMEDIATE MEDICAL CARE IF:  You have suicidal feelings.   You think you may harm the baby or someone else. MAKE SURE YOU:  Understand these instructions.  Will watch your condition.  Will get help right away if you are not doing well or get worse.   This information is not intended to replace advice given to you by your health care provider. Make sure you discuss any questions you have with your health care provider.   Document Released: 06/18/2004 Document Revised: 09/19/2013 Document Reviewed: 06/26/2013 Elsevier Interactive Patient Education 2016 [employer] Inc[employer] Postpartum Care After Cesarean Delivery After you deliver your  newborn (postpartum period), the usual stay in the hospital is 24-72 hours. If there were problems with your labor or delivery, or if you have other medical problems, you might be in the hospital longer.  While you are in the hospital, you will receive help and instructions on how to care for yourself and your newborn during the postpartum period.  While you are in the hospital:  It is normal for you to have pain or discomfort from the incision in your abdomen. Be sure to tell your nurses when you are having pain, where the pain is located, and what makes the pain worse.  If you are breastfeeding, you may feel uncomfortable contractions of your uterus for a couple of weeks. This is normal. The contractions help your uterus get back to normal size.  It is normal to have some bleeding after delivery.  For the first 1-3 days after delivery, the flow is red and the amount may be similar to a period.  It is common for the flow to start and stop.  In the first few days, you may pass some small clots. Let  your nurses know if you begin to pass large clots or your flow increases.  Do not  flush blood clots down the toilet before having the nurse look at them.  During the next 3-10 days after delivery, your flow should become more watery and pink or brown-tinged in color.  Ten to fourteen days after delivery, your flow should be a small amount of yellowish-white discharge.  The amount of your flow will decrease over the first few weeks after delivery. Your flow may stop in 6-8 weeks. Most women have had their flow stop by 12 weeks after delivery.  You should change your sanitary pads frequently.  Wash your hands thoroughly with soap and water for at least 20 seconds after changing pads, using the toilet, or before holding or feeding your newborn.  Your intravenous (IV) tubing will be removed when you are drinking enough fluids.  The urine drainage tube (urinary catheter) that was inserted before delivery may be removed within 6-8 hours after delivery or when feeling returns to your legs. You should feel like you need to empty your bladder within the first 6-8 hours after the catheter has been removed.  In case you become weak, lightheaded, or faint, call your nurse before you get out of bed for the first time and before you take a shower for the first time.  Within the first few days after delivery, your breasts may begin to feel tender and full. This is called engorgement. Breast tenderness usually goes away within 48-72 hours after engorgement occurs. You may also notice milk leaking from your breasts. If you are not breastfeeding, do not stimulate your breasts. Breast stimulation can make your breasts produce more milk.  Spending as much time as possible with your newborn is very important. During this time, you and your newborn can feel close and get to know each other. Having your newborn stay in your room (rooming in) will help to strengthen the bond with your newborn. It will give you time  to get to know your newborn and become comfortable caring for your newborn.  Your hormones change after delivery. Sometimes the hormone changes can temporarily cause you to feel sad or tearful. These feelings should not last more than a few days. If these feelings last longer than that, you should talk to your caregiver.  If desired, talk to your caregiver  about methods of family planning or contraception.  Talk to your caregiver about immunizations. Your caregiver may want you to have the following immunizations before leaving the hospital:  Tetanus, diphtheria, and pertussis (Tdap) or tetanus and diphtheria (Td) immunization. It is very important that you and your family (including grandparents) or others caring for your newborn are up-to-date with the Tdap or Td immunizations. The Tdap or Td immunization can help protect your newborn from getting ill.  Rubella immunization.  Varicella (chickenpox) immunization.  Influenza immunization. You should receive this annual immunization if you did not receive the immunization during your pregnancy.   This information is not intended to replace advice given to you by your health care provider. Make sure you discuss any questions you have with your health care provider.   Document Released: 06/08/2012 Document Reviewed: 06/08/2012 Elsevier Interactive Patient Education 2016 [employer]. Breastfeeding and Mastitis Mastitis is inflammation of the breast tissue. It can occur in women who are breastfeeding. This can make breastfeeding painful. Mastitis will sometimes go away on its own. Your health care provider will help determine if treatment is needed. CAUSES Mastitis is often associated with a blocked milk (lactiferous) duct. This can happen when too much milk builds up in the breast. Causes of excess milk in the breast can include:  Poor latch-on. If your baby is not latched onto the breast properly, she or he may not empty your breast  completely while breastfeeding.  Allowing too much time to pass between feedings.  Wearing a bra or other clothing that is too tight. This puts extra pressure on the lactiferous ducts so milk does not flow through them as it should. Mastitis can also be caused by a bacterial infection. Bacteria may enter the breast tissue through cuts or openings in the skin. In women who are breastfeeding, this may occur because of cracked or irritated skin. Cracks in the skin are often caused when your baby does not latch on properly to the breast. SIGNS AND SYMPTOMS  Swelling, redness, tenderness, and pain in an area of the breast.  Swelling of the glands under the arm on the same side.  Fever may or may not accompany mastitis. If an infection is allowed to progress, a collection of pus (abscess) may develop. DIAGNOSIS  Your health care provider can usually diagnose mastitis based on your symptoms and a physical exam. Tests may be done to help confirm the diagnosis. These may include:  Removal of pus from the breast by applying pressure to the area. This pus can be examined in the lab to determine which bacteria are present. If an abscess has developed, the fluid in the abscess can be removed with a needle. This can also be used to confirm the diagnosis and determine the bacteria present. In most cases, pus will not be present.  Blood tests to determine if your body is fighting a bacterial infection.  Mammogram or ultrasound tests to rule out other problems or diseases. TREATMENT  Mastitis that occurs with breastfeeding will sometimes go away on its own. Your health care provider may choose to wait 24 hours after first seeing you to decide whether a prescription medicine is needed. If your symptoms are worse after 24 hours, your health care provider will likely prescribe an antibiotic medicine to treat the mastitis. He or she will determine which bacteria are most likely causing the infection and will then  select an appropriate antibiotic medicine. This is sometimes changed based on the results of tests  performed to identify the bacteria, or if there is no response to the antibiotic medicine selected. Antibiotic medicines are usually given by mouth. You may also be given medicine for pain. HOME CARE INSTRUCTIONS  Only take over-the-counter or prescription medicines for pain, fever, or discomfort as directed by your health care provider.  If your health care provider prescribed an antibiotic medicine, take the medicine as directed. Make sure you finish it even if you start to feel better.  Do not wear a tight or underwire bra. Wear a soft, supportive bra.  Increase your fluid intake, especially if you have a fever.  Continue to empty the breast. Your health care provider can tell you whether this milk is safe for your infant or needs to be thrown out. You may be told to stop nursing until your health care provider thinks it is safe for your baby. Use a breast pump if you are advised to stop nursing.  Keep your nipples clean and dry.  Empty the first breast completely before going to the other breast. If your baby is not emptying your breasts completely for some reason, use a breast pump to empty your breasts.  If you go back to work, pump your breasts while at work to stay in time with your nursing schedule.  Avoid allowing your breasts to become overly filled with milk (engorged). SEEK MEDICAL CARE IF:  You have pus-like discharge from the breast.  Your symptoms do not improve with the treatment prescribed by your health care provider within 2 days. SEEK IMMEDIATE MEDICAL CARE IF:  Your pain and swelling are getting worse.  You have pain that is not controlled with medicine.  You have a red line extending from the breast toward your armpit.  You have a fever or persistent symptoms for more than 2-3 days.  You have a fever and your symptoms suddenly get worse. MAKE SURE YOU:    Understand these instructions.  Will watch your condition.  Will get help right away if you are not doing well or get worse.   This information is not intended to replace advice given to you by your health care provider. Make sure you discuss any questions you have with your health care provider.   Document Released: 01/09/2005 Document Revised: 09/19/2013 Document Reviewed: 04/20/2013 Elsevier Interactive Patient Education 2016 [employer]. Breast Pumping Tips If you are breastfeeding, there may be times when you cannot feed your baby directly. Returning to work or going on a trip are common examples. Pumping allows you to store breast milk and feed it to your baby later.  You may not get much milk when you first start to pump. Your breasts should start to make more after a few days. If you pump at the times you usually feed your baby, you may be able to keep making enough milk to feed your baby without also using formula. The more often you pump, the more milk you will produce. WHEN SHOULD I PUMP?   You can begin to pump soon after delivery. However, some experts recommend waiting about 4 weeks before giving your infant a bottle to make sure breastfeeding is going well.  If you plan to return to work, begin pumping a few weeks before. This will help you develop techniques that work best for you. It also lets you build up a supply of breast milk.   When you are with your infant, feed on demand and pump after each feeding.   When you are  away from your infant for several hours, pump for about 15 minutes every 2-3 hours. Pump both breasts at the same time if you can.   If your infant has a formula feeding, make sure to pump around the same time.   If you drink any alcohol, wait 2 hours before pumping.  HOW DO I PREPARE TO PUMP? Your let-down reflexis the natural reaction to stimulation that makes your breast milk flow. It is easier to stimulate this reflex when you are  relaxed. Find relaxation techniques that work for you. If you have difficulty with your let-down reflex, try these methods:   Smell one of your infant's blankets or an item of clothing.   Look at a picture or video of your infant.   Sit in a quiet, private space.   Massage the breast you plan to pump.   Place soothing warmth on the breast.   Play relaxing music.  WHAT ARE SOME GENERAL BREAST PUMPING TIPS?  Wash your hands before you pump. You do not need to wash your nipples or breasts.  There are three ways to pump.  You can use your hand to massage and compress your breast.  You can use a handheld manual pump.  You can use an electric pump.   Make sure the suction cup (flange) on the breast pump is the right size. Place the flange directly over the nipple. If it is the wrong size or placed the wrong way, it may be painful and cause nipple damage.   If pumping is uncomfortable, apply a small amount of purified or modified lanolin to your nipple and areola.  If you are using an electric pump, adjust the speed and suction power to be more comfortable.  If pumping is painful or if you are not getting very much milk, you may need a different type of pump. A lactation consultant can help you determine what type of pump to use.   Keep a full water bottle near you at all times. Drinking lots of fluid helps you make more milk.  You can store your milk to use later. Pumped breast milk can be stored in a sealable, sterile container or plastic bag. Label all stored breast milk with the date you pumped it.  Milk can stay out at room temperature for up to 8 hours.  You can store your milk in the refrigerator for up to 8 days.  You can store your milk in the freezer for 3 months. Thaw frozen milk using warm water. Do not put it in the microwave.  Do not smoke. Smoking can lower your milk supply and harm your infant. If you need help quitting, ask your health care provider to  recommend a program.  WHEN SHOULD I CALL MY HEALTH CARE PROVIDER OR A LACTATION CONSULTANT?  You are having trouble pumping.  You are concerned that you are not making enough milk.  You have nipple pain, soreness, or redness.  You want to use birth control. Birth control pills may lower your milk supply. Talk to your health care provider about your options.   This information is not intended to replace advice given to you by your health care provider. Make sure you discuss any questions you have with your health care provider.   Document Released: 03/04/2010 Document Revised: 09/19/2013 Document Reviewed: 07/07/2013 Elsevier Interactive Patient Education [employer] Breastfeeding Deciding to breastfeed is one of the best choices you can make for you and your baby. A change  in hormones during pregnancy causes your breast tissue to grow and increases the number and size of your milk ducts. These hormones also allow proteins, sugars, and fats from your blood supply to make breast milk in your milk-producing glands. Hormones prevent breast milk from being released before your baby is born as well as prompt milk flow after birth. Once breastfeeding has begun, thoughts of your baby, as well as his or her sucking or crying, can stimulate the release of milk from your milk-producing glands.  BENEFITS OF BREASTFEEDING For Your Baby  Your first milk (colostrum) helps your baby's digestive system function better.  There are antibodies in your milk that help your baby fight off infections.  Your baby has a lower incidence of asthma, allergies, and sudden infant death syndrome.  The nutrients in breast milk are better for your baby than infant formulas and are designed uniquely for your baby's needs.  Breast milk improves your baby's brain development.  Your baby is less likely to develop other conditions, such as childhood obesity, asthma, or type 2 diabetes mellitus. For  You  Breastfeeding helps to create a very special bond between you and your baby.  Breastfeeding is convenient. Breast milk is always available at the correct temperature and costs nothing.  Breastfeeding helps to burn calories and helps you lose the weight gained during pregnancy.  Breastfeeding makes your uterus contract to its prepregnancy size faster and slows bleeding (lochia) after you give birth.   Breastfeeding helps to lower your risk of developing type 2 diabetes mellitus, osteoporosis, and breast or ovarian cancer later in life. SIGNS THAT YOUR BABY IS HUNGRY Early Signs of Hunger  Increased alertness or activity.  Stretching.  Movement of the head from side to side.  Movement of the head and opening of the mouth when the corner of the mouth or cheek is stroked (rooting).  Increased sucking sounds, smacking lips, cooing, sighing, or squeaking.  Hand-to-mouth movements.  Increased sucking of fingers or hands. Late Signs of Hunger  Fussing.  Intermittent crying. Extreme Signs of Hunger Signs of extreme hunger will require calming and consoling before your baby will be able to breastfeed successfully. Do not wait for the following signs of extreme hunger to occur before you initiate breastfeeding:  Restlessness.  A loud, strong cry.  Screaming. BREASTFEEDING BASICS Breastfeeding Initiation  Find a comfortable place to sit or lie down, with your neck and back well supported.  Place a pillow or rolled up blanket under your baby to bring him or her to the level of your breast (if you are seated). Nursing pillows are specially designed to help support your arms and your baby while you breastfeed.  Make sure that your baby's abdomen is facing your abdomen.  Gently massage your breast. With your fingertips, massage from your chest wall toward your nipple in a circular motion. This encourages milk flow. You may need to continue this action during the feeding if your  milk flows slowly.  Support your breast with 4 fingers underneath and your thumb above your nipple. Make sure your fingers are well away from your nipple and your baby's mouth.  Stroke your baby's lips gently with your finger or nipple.  When your baby's mouth is open wide enough, quickly bring your baby to your breast, placing your entire nipple and as much of the colored area around your nipple (areola) as possible into your baby's mouth.  More areola should be visible above your baby's upper lip than  below the lower lip.  Your baby's tongue should be between his or her lower gum and your breast.  Ensure that your baby's mouth is correctly positioned around your nipple (latched). Your baby's lips should create a seal on your breast and be turned out (everted).  It is common for your baby to suck about 2-3 minutes in order to start the flow of breast milk. Latching Teaching your baby how to latch on to your breast properly is very important. An improper latch can cause nipple pain and decreased milk supply for you and poor weight gain in your baby. Also, if your baby is not latched onto your nipple properly, he or she may swallow some air during feeding. This can make your baby fussy. Burping your baby when you switch breasts during the feeding can help to get rid of the air. However, teaching your baby to latch on properly is still the best way to prevent fussiness from swallowing air while breastfeeding. Signs that your baby has successfully latched on to your nipple:  Silent tugging or silent sucking, without causing you pain.  Swallowing heard between every 3-4 sucks.  Muscle movement above and in front of his or her ears while sucking. Signs that your baby has not successfully latched on to nipple:  Sucking sounds or smacking sounds from your baby while breastfeeding.  Nipple pain. If you think your baby has not latched on correctly, slip your finger into the corner of your baby's  mouth to break the suction and place it between your baby's gums. Attempt breastfeeding initiation again. Signs of Successful Breastfeeding Signs from your baby:  A gradual decrease in the number of sucks or complete cessation of sucking.  Falling asleep.  Relaxation of his or her body.  Retention of a small amount of milk in his or her mouth.  Letting go of your breast by himself or herself. Signs from you:  Breasts that have increased in firmness, weight, and size 1-3 hours after feeding.  Breasts that are softer immediately after breastfeeding.  Increased milk volume, as well as a change in milk consistency and color by the fifth day of breastfeeding.  Nipples that are not sore, cracked, or bleeding. Signs That Your Pecola Leisure is Getting Enough Milk  Wetting at least 3 diapers in a 24-hour period. The urine should be clear and pale yellow by age 11 days.  At least 3 stools in a 24-hour period by age 11 days. The stool should be soft and yellow.  At least 3 stools in a 24-hour period by age 61 days. The stool should be seedy and yellow.  No loss of weight greater than 10% of birth weight during the first 31 days of age.  Average weight gain of 4-7 ounces (113-198 g) per week after age 48 days.  Consistent daily weight gain by age 11 days, without weight loss after the age of 2 weeks. After a feeding, your baby may spit up a small amount. This is common. BREASTFEEDING FREQUENCY AND DURATION Frequent feeding will help you make more milk and can prevent sore nipples and breast engorgement. Breastfeed when you feel the need to reduce the fullness of your breasts or when your baby shows signs of hunger. This is called "breastfeeding on demand." Avoid introducing a pacifier to your baby while you are working to establish breastfeeding (the first 4-6 weeks after your baby is born). After this time you may choose to use a pacifier. Research has shown that pacifier  use during the first year of a  baby's life decreases the risk of sudden infant death syndrome (SIDS). Allow your baby to feed on each breast as long as he or she wants. Breastfeed until your baby is finished feeding. When your baby unlatches or falls asleep while feeding from the first breast, offer the second breast. Because newborns are often sleepy in the first few weeks of life, you may need to awaken your baby to get him or her to feed. Breastfeeding times will vary from baby to baby. However, the following rules can serve as a guide to help you ensure that your baby is properly fed:  Newborns (babies 74 weeks of age or younger) may breastfeed every 1-3 hours.  Newborns should not go longer than 3 hours during the day or 5 hours during the night without breastfeeding.  You should breastfeed your baby a minimum of 8 times in a 24-hour period until you begin to introduce solid foods to your baby at around 78 months of age. BREAST MILK PUMPING Pumping and storing breast milk allows you to ensure that your baby is exclusively fed your breast milk, even at times when you are unable to breastfeed. This is especially important if you are going back to work while you are still breastfeeding or when you are not able to be present during feedings. Your lactation consultant can give you guidelines on how long it is safe to store breast milk. A breast pump is a machine that allows you to pump milk from your breast into a sterile bottle. The pumped breast milk can then be stored in a refrigerator or freezer. Some breast pumps are operated by hand, while others use electricity. Ask your lactation consultant which type will work best for you. Breast pumps can be purchased, but some hospitals and breastfeeding support groups lease breast pumps on a monthly basis. A lactation consultant can teach you how to hand express breast milk, if you prefer not to use a pump. CARING FOR YOUR BREASTS WHILE YOU BREASTFEED Nipples can become dry, cracked, and  sore while breastfeeding. The following recommendations can help keep your breasts moisturized and healthy:  Avoid using soap on your nipples.  Wear a supportive bra. Although not required, special nursing bras and tank tops are designed to allow access to your breasts for breastfeeding without taking off your entire bra or top. Avoid wearing underwire-style bras or extremely tight bras.  Air dry your nipples for 3-69mnutes after each feeding.  Use only cotton bra pads to absorb leaked breast milk. Leaking of breast milk between feedings is normal.  Use lanolin on your nipples after breastfeeding. Lanolin helps to maintain your skin's normal moisture barrier. If you use pure lanolin, you do not need to wash it off before feeding your baby again. Pure lanolin is not toxic to your baby. You may also hand express a few drops of breast milk and gently massage that milk into your nipples and allow the milk to air dry. In the first few weeks after giving birth, some women experience extremely full breasts (engorgement). Engorgement can make your breasts feel heavy, warm, and tender to the touch. Engorgement peaks within 3-5 days after you give birth. The following recommendations can help ease engorgement:  Completely empty your breasts while breastfeeding or pumping. You may want to start by applying warm, moist heat (in the shower or with warm water-soaked hand towels) just before feeding or pumping. This increases circulation and helps the milk  flow. If your baby does not completely empty your breasts while breastfeeding, pump any extra milk after he or she is finished.  Wear a snug bra (nursing or regular) or tank top for 1-2 days to signal your body to slightly decrease milk production.  Apply ice packs to your breasts, unless this is too uncomfortable for you.  Make sure that your baby is latched on and positioned properly while breastfeeding. If engorgement persists after 48 hours of following  these recommendations, contact your health care provider or a Advertising copywriter. OVERALL HEALTH CARE RECOMMENDATIONS WHILE BREASTFEEDING  Eat healthy foods. Alternate between meals and snacks, eating 3 of each per day. Because what you eat affects your breast milk, some of the foods may make your baby more irritable than usual. Avoid eating these foods if you are sure that they are negatively affecting your baby.  Drink milk, fruit juice, and water to satisfy your thirst (about 10 glasses a day).  Rest often, relax, and continue to take your prenatal vitamins to prevent fatigue, stress, and anemia.  Continue breast self-awareness checks.  Avoid chewing and smoking tobacco. Chemicals from cigarettes that pass into breast milk and exposure to secondhand smoke may harm your baby.  Avoid alcohol and drug use, including marijuana. Some medicines that may be harmful to your baby can pass through breast milk. It is important to ask your health care provider before taking any medicine, including all over-the-counter and prescription medicine as well as vitamin and herbal supplements. It is possible to become pregnant while breastfeeding. If birth control is desired, ask your health care provider about options that will be safe for your baby. SEEK MEDICAL CARE IF:  You feel like you want to stop breastfeeding or have become frustrated with breastfeeding.  You have painful breasts or nipples.  Your nipples are cracked or bleeding.  Your breasts are red, tender, or warm.  You have a swollen area on either breast.  You have a fever or chills.  You have nausea or vomiting.  You have drainage other than breast milk from your nipples.  Your breasts do not become full before feedings by the fifth day after you give birth.  You feel sad and depressed.  Your baby is too sleepy to eat well.  Your baby is having trouble sleeping.   Your baby is wetting less than 3 diapers in a 24-hour  period.  Your baby has less than 3 stools in a 24-hour period.  Your baby's skin or the white part of his or her eyes becomes yellow.   Your baby is not gaining weight by 40 days of age. SEEK IMMEDIATE MEDICAL CARE IF:  Your baby is overly tired (lethargic) and does not want to wake up and feed.  Your baby develops an unexplained fever.   This information is not intended to replace advice given to you by your health care provider. Make sure you discuss any questions you have with your health care provider.   Document Released: 09/14/2005 Document Revised: 06/05/2015 Document Reviewed: 03/08/2013 Elsevier Interactive Patient Education [employer]

## 2015-08-27 NOTE — Progress Notes (Signed)
POD # 3 S/P Primary Cesarean Delivery d/t SeverePEC  Subjective: Pt reports feeling well. Pain controlled with Motrin and Percocet Tolerating po/Voiding without problems/ No n/v/ Flatus present. No BM. No SOB or CP No blurred vision since yesterday No HA or epigastric pain Activity: OOB with assist Bleeding is light with small clots  Newborn info:  Information for the patient's newborn:  Connie Logan, Girl Connie Logan [[id number]][[id number]]  female    Feeding: breast and bottle  Objective: VS:  BP:  Filed Vitals:   08/26/15 2353 08/27/15 0544 08/27/15 0811 08/27/15 0812  BP:  132/75  140/76  Pulse: 80 90 96 87  Temp:    97.9 F (36.6 C)  TempSrc:    Oral  Resp:  18  16  Height:      Weight:  86.274 kg (190 lb 3.2 oz)    SpO2: 97%  97%     I&O: Intake/Output      11/28 0701 - 11/29 0700 11/29 0701 - 11/30 0700   P.O.     I.V. (mL/kg)     Total Intake(mL/kg)     Urine (mL/kg/hr)     Total Output       Net              LABS:   Recent Labs  08/25/15 0610 08/26/15 0535  WBC 17.7* 20.7*  HGB 8.3* 9.0*  HCT 25.4* 27.5*  PLT 134* 157    Blood type: O POS (11/26 1504) Rubella:   Immune   Physical Exam:  General: alert, cooperative and no distress CV: Regular rate and rhythm Resp: CTA bilaterally Abdomen: soft, nontender, normal bowel sounds Uterine Fundus: firm, below umbilicus, nontender Incision: Covered with Tegaderm and honeycomb dressing; no significant drainage, edema, bruising, or erythema; well approximated with suture Lochia: minimal Ext: edema 3+ LE and Homans sign is negative, no sign of DVT   Assessment/: POD # 3 / [id number] / S/P C/Section d/t severe PEC  Preeclampsia, delivered Gestational edema, postpartum  Doing well  Plan: Good diuresis, asymptomatic, LFTs improving but elevated BPs Continue Labetalol today Start HCTZ 25 mg daily x 5 days Continue routine post op orders Anticipate discharge home today   Signed: Kenard GowerAWSON, [name], M, MSN,  CNM 08/27/2015, 10:24 AM

## 2015-08-28 ENCOUNTER — Encounter (HOSPITAL_COMMUNITY): Payer: Self-pay | Admitting: Obstetrics and Gynecology

## 2016-03-10 ENCOUNTER — Encounter: Payer: Self-pay | Admitting: Family

## 2016-03-10 ENCOUNTER — Ambulatory Visit (INDEPENDENT_AMBULATORY_CARE_PROVIDER_SITE_OTHER): Payer: PRIVATE HEALTH INSURANCE | Admitting: Family

## 2016-03-10 VITALS — BP 109/72 | HR 63 | Temp 97.4°F | Ht 63.0 in | Wt 150.6 lb

## 2016-03-10 DIAGNOSIS — S8392XA Sprain of unspecified site of left knee, initial encounter: Secondary | ICD-10-CM | POA: Diagnosis not present

## 2016-03-10 MED ORDER — NAPROXEN 500 MG PO TABS: 500.0000 mg | ORAL_TABLET | Freq: Two times a day (BID) | ORAL | Status: DC

## 2016-03-10 MED ORDER — METHYLPREDNISOLONE ACETATE 80 MG/ML IJ SUSP
80.0000 mg | Freq: Once | INTRAMUSCULAR | Status: AC
  Administered 2016-03-10: 80 mg via INTRAMUSCULAR

## 2016-03-10 MED ORDER — KETOROLAC TROMETHAMINE 60 MG/2ML IM SOLN
60.0000 mg | Freq: Once | INTRAMUSCULAR | Status: AC
  Administered 2016-03-10: 60 mg via INTRAMUSCULAR

## 2016-03-10 NOTE — Patient Instructions (Signed)

## 2016-03-10 NOTE — Progress Notes (Signed)
   Subjective:    Patient ID: Margorie Johnesh M Falter, female    DOB: 10/03/1980, 35 y.o.   MRN: [id number]  Knee Pain  The incident occurred 2 days ago. There was no injury mechanism. The pain is present in the left knee. The quality of the pain is described as aching. The pain is at a severity of 7/10. The pain is moderate. The pain has been constant since onset. Pertinent negatives include no numbness or tingling. She reports no foreign bodies present. The symptoms are aggravated by weight bearing and movement. She has tried NSAIDs for the symptoms. The treatment provided moderate relief.      Review of Systems  Constitutional: Negative.   HENT: Negative.   Eyes: Negative.   Respiratory: Negative.  Negative for shortness of breath.   Cardiovascular: Negative.  Negative for palpitations.  Gastrointestinal: Negative.   Endocrine: Negative.   Genitourinary: Negative.   Musculoskeletal: Negative.   Neurological: Negative.  Negative for tingling, numbness and headaches.  Hematological: Negative.   Psychiatric/Behavioral: Negative.   All other systems reviewed and are negative.      Objective:   Physical Exam  Constitutional: She is oriented to person, place, and time. She appears well-developed and well-nourished. No distress.  HENT:  Head: Normocephalic and atraumatic.  Right Ear: External ear normal.  Mouth/Throat: Oropharynx is clear and moist.  Eyes: Pupils are equal, round, and reactive to light.  Neck: Normal range of motion. Neck supple. No thyromegaly present.  Cardiovascular: Normal rate, regular rhythm, normal heart sounds and intact distal pulses.   No murmur heard. Pulmonary/Chest: Effort normal and breath sounds normal. No respiratory distress. She has no wheezes.  Abdominal: Soft. Bowel sounds are normal. She exhibits no distension. There is no tenderness.  Musculoskeletal: Normal range of motion. She exhibits no edema or tenderness.  Neurological: She is alert and oriented  to person, place, and time.  Skin: Skin is warm and dry.  Psychiatric: She has a normal mood and affect. Her behavior is normal. Judgment and thought content normal.  Vitals reviewed.   BP 109/72 mmHg  Pulse 63  Temp(Src) 97.4 F (36.3 C) (Oral)  Ht 5\' 3"  (1.6 m)  Wt 150 lb 9.6 oz (68.312 kg)  BMI 26.68 kg/m2  Breastfeeding? Yes       Assessment & Plan:  1. Knee sprain, left, initial encounter -Rest -Ice -ROM exercises -RTO prn  - methylPREDNISolone acetate (DEPO-MEDROL) injection 80 mg; Inject 1 mL (80 mg total) into the muscle once. - ketorolac (TORADOL) injection 60 mg; Inject 2 mLs (60 mg total) into the muscle once. - naproxen (NAPROSYN) 500 MG tablet; Take 1 tablet (500 mg total) by mouth 2 (two) times daily with a meal.  Dispense: 60 tablet; Refill: 1  Jannifer Rodneyhristy Janequa Kipnis, FNP

## 2016-07-30 ENCOUNTER — Ambulatory Visit (INDEPENDENT_AMBULATORY_CARE_PROVIDER_SITE_OTHER): Payer: PRIVATE HEALTH INSURANCE

## 2016-07-30 DIAGNOSIS — Z23 Encounter for immunization: Secondary | ICD-10-CM

## 2016-08-26 ENCOUNTER — Encounter: Payer: Self-pay | Admitting: Pediatrics

## 2016-08-26 ENCOUNTER — Ambulatory Visit (INDEPENDENT_AMBULATORY_CARE_PROVIDER_SITE_OTHER): Payer: PRIVATE HEALTH INSURANCE | Admitting: Pediatrics

## 2016-08-26 VITALS — BP 114/74 | HR 69 | Temp 96.7°F | Ht 63.0 in | Wt 155.0 lb

## 2016-08-26 DIAGNOSIS — Z0000 Encounter for general adult medical examination without abnormal findings: Secondary | ICD-10-CM | POA: Diagnosis not present

## 2016-08-26 NOTE — Progress Notes (Signed)
  Subjective:   Patient ID: Connie Logan, female    DOB: 05-22-1981, 35 y.o.   MRN: [id number] CC: Annual Exam  HPI: Connie Logan is a 35 y.o. female presenting for Annual Exam  H/o endometriosis, on sprintec now, symptoms much improved Feeling well overall Needs labs and annual for insurance Not sleeping well with [address] home, waking up frequently most nights Mood is fine  Relevant past medical, surgical, family and social history reviewed. Allergies and medications reviewed and updated. History  Smoking Status  . Never Smoker  Smokeless Tobacco  . Never Used   ROS: All systems negative other than what is in HPI  Objective:    BP 114/74   Pulse 69   Temp (!) 96.7 F (35.9 C) (Oral)   Ht '5\' 3"'$  (1.6 m)   Wt 155 lb (70.3 kg)   BMI 27.46 kg/m   Wt Readings from Last 3 Encounters:  08/26/16 155 lb (70.3 kg)  03/10/16 150 lb 9.6 oz (68.3 kg)  08/27/15 190 lb 3.2 oz (86.3 kg)    Gen: NAD, alert, cooperative with exam, NCAT EYES: EOMI, no conjunctival injection, or no icterus ENT:   OP without erythema LYMPH: no cervical LAD CV: NRRR, normal S1/S2, no murmur, distal pulses 2+ b/l Resp: CTABL, no wheezes, normal WOB Abd: +BS, soft, NTND. no guarding or organomegaly Ext: No edema, warm Neuro: Alert and oriented, strength equal b/l UE and LE, coordination grossly normal MSK: normal muscle bulk  Assessment & Plan:  TKaowas seen today for annual exam.  Diagnoses and all orders for this visit:  Encounter for preventive health examination Follows with gyn for pap smears, UTD Overall feeling well Blood work today Cont OCP -     CBC with Differential/Platelet -     CMP14+EGFR -     Anemia Profile B -     Lipid panel -     VITAMIN D 25 Hydroxy (Vit-D Deficiency, Fractures) -     TSH   Follow up plan: 1 yr, sooner if needed CAssunta Found MD WNason

## 2016-08-27 LAB — CMP14+EGFR
ALBUMIN: 4 g/dL (ref 3.5–5.5)
ALK PHOS: 87 IU/L (ref 39–117)
ALT: 10 IU/L (ref 0–32)
AST: 16 IU/L (ref 0–40)
Albumin/Globulin Ratio: 1.3 (ref 1.2–2.2)
BUN / CREAT RATIO: 12 (ref 9–23)
BUN: 9 mg/dL (ref 6–20)
Bilirubin Total: 0.2 mg/dL (ref 0.0–1.2)
CO2: 24 mmol/L (ref 18–29)
CREATININE: 0.77 mg/dL (ref 0.57–1.00)
Calcium: 8.4 mg/dL — ABNORMAL LOW (ref 8.7–10.2)
Chloride: 101 mmol/L (ref 96–106)
GFR calc Af Amer: 117 mL/min/{1.73_m2} (ref >59)
GFR calc non Af Amer: 102 mL/min/{1.73_m2} (ref >59)
GLUCOSE: 84 mg/dL (ref 65–99)
Globulin, Total: 3 g/dL (ref 1.5–4.5)
Potassium: 4.6 mmol/L (ref 3.5–5.2)
Sodium: 138 mmol/L (ref 134–144)
Total Protein: 7 g/dL (ref 6.0–8.5)

## 2016-08-27 LAB — ANEMIA PROFILE B
BASOS: 0 %
Basophils Absolute: 0 10*3/uL (ref 0.0–0.2)
EOS (ABSOLUTE): 0.1 10*3/uL (ref 0.0–0.4)
EOS: 2 %
FOLATE: 7.5 ng/mL (ref >3.0)
Ferritin: 28 ng/mL (ref 15–150)
HEMOGLOBIN: 13.3 g/dL (ref 11.1–15.9)
Hematocrit: 39.8 % (ref 34.0–46.6)
IRON SATURATION: 20 % (ref 15–55)
IRON: 78 ug/dL (ref 27–159)
Immature Grans (Abs): 0 10*3/uL (ref 0.0–0.1)
Immature Granulocytes: 0 %
LYMPHS ABS: 2.2 10*3/uL (ref 0.7–3.1)
Lymphs: 36 %
MCH: 29.3 pg (ref 26.6–33.0)
MCHC: 33.4 g/dL (ref 31.5–35.7)
MCV: 88 fL (ref 79–97)
Monocytes Absolute: 0.5 10*3/uL (ref 0.1–0.9)
Monocytes: 9 %
NEUTROS ABS: 3.2 10*3/uL (ref 1.4–7.0)
Neutrophils: 53 %
Platelets: 303 10*3/uL (ref 150–379)
RBC: 4.54 x10E6/uL (ref 3.77–5.28)
RDW: 12.4 % (ref 12.3–15.4)
Retic Ct Pct: 1.4 % (ref 0.6–2.6)
TIBC: 386 ug/dL (ref 250–450)
UIBC: 308 ug/dL (ref 131–425)
VITAMIN B 12: 168 pg/mL — AB (ref 211–946)
WBC: 6.1 10*3/uL (ref 3.4–10.8)

## 2016-08-27 LAB — LIPID PANEL
CHOL/HDL RATIO: 2.6 ratio (ref 0.0–4.4)
Cholesterol, Total: 199 mg/dL (ref 100–199)
HDL: 76 mg/dL (ref >39)
LDL CALC: 100 mg/dL — AB (ref 0–99)
Triglycerides: 117 mg/dL (ref 0–149)
VLDL Cholesterol Cal: 23 mg/dL (ref 5–40)

## 2016-08-27 LAB — TSH: TSH: 1.4 u[IU]/mL (ref 0.450–4.500)

## 2016-08-27 LAB — VITAMIN D 25 HYDROXY (VIT D DEFICIENCY, FRACTURES): VIT D 25 HYDROXY: 33 ng/mL (ref 30.0–100.0)

## 2016-09-18 ENCOUNTER — Encounter: Payer: Self-pay | Admitting: Pediatrics

## 2016-09-18 ENCOUNTER — Ambulatory Visit (INDEPENDENT_AMBULATORY_CARE_PROVIDER_SITE_OTHER): Payer: PRIVATE HEALTH INSURANCE | Admitting: Pediatrics

## 2016-09-18 VITALS — BP 114/72 | HR 69 | Temp 97.8°F | Ht 63.0 in | Wt 157.4 lb

## 2016-09-18 DIAGNOSIS — H6690 Otitis media, unspecified, unspecified ear: Secondary | ICD-10-CM

## 2016-09-18 DIAGNOSIS — J029 Acute pharyngitis, unspecified: Secondary | ICD-10-CM | POA: Diagnosis not present

## 2016-09-18 MED ORDER — AZITHROMYCIN 250 MG PO TABS
ORAL_TABLET | ORAL | 0 refills | Status: DC
Start: 2016-09-18 — End: 2016-12-23

## 2016-09-18 NOTE — Patient Instructions (Signed)
BUFFERED ISOTONIC SALINE NASAL IRRIGATION  The Benefits:  1. When you irrigate, the isotonic saline (salt water) acts as a solvent and washes the mucus crusts and other debris from your nose.  2. This decongests and improves the airflow into your nose. The sinus passages begin to open.  3. Studies have also shown that a salt water and an alkaline (baking soda) irrigation solution improves nasal membrane cell function (mucociliary flow of mucus debris).  The Recipe:  1. Choose a 1-quart glass jar that is thoroughly cleansed.  2. Fill with sterile or distilled water, or you can boil water from the tap.  3. Add 1 to 2 heaping teaspoons of "pickling/canning/sea" salt (NOT table salt as it contains a large number of additives). This salt is available at the grocery store in the food canning section.  4. Add 1 teaspoon of [employer] Baking Soda (pure bicarbonate).  5. Mix ingredients together and store at room temperature. Discard after one week. If you find this solution too strong, you may decrease the amount of salt added to 1 to 1  teaspoons. With children it is often best to start with a milder solution and advance slowly. Irrigate with 240 ml (8 oz) twice daily.  The Instructions:  You should plan to irrigate your nose with buffered isotonic saline 2 times per day. Many people prefer to warm the solution slightly in the microwave - but be sure that the solution is NOT HOT. Stand over the sink (some do this in the shower) and squirt the solution into each side of your nose, keeping your mouth open. This allows you to spit the saltwater out of your mouth. It will not harm you if you swallow a little.  If you have been told to use a nasal steroid such as Flonase, Nasonex, or Nasacort, you should always use isortonic saline solution first, then use your nasal steroid product. The nasal steroid is much more effective when sprayed onto clean nasal membranes and the steroid medicine will reach  deeper into the nose.  Most people experience a little burning sensation the first few times they use a isotonic saline solution, but this usually goes away within a few days.    

## 2016-09-18 NOTE — Progress Notes (Signed)
  Subjective:   Patient ID: Margorie Johnesh M Gilani, female    DOB: 01/26/1981, [age] y.o.   MRN: [id number] CC: Hoarse; Sore Throat; and Cough  HPI: Margorie Johnesh M Rosselli is a 35 y.o. female presenting for Hoarse; Sore Throat; and Cough  6-7 days ago started having URI symptoms  started sore throat, has been getting worse Still with coughing, drainage Throat very raw from coughing Keeping her awake at night Some chills, no fevers Lots of nasal congestion Taking mucinex, antihistamine No ear pain  Relevant past medical, surgical, family and social history reviewed. Allergies and medications reviewed and updated. History  Smoking Status  . Never Smoker  Smokeless Tobacco  . Never Used   ROS: Per HPI   Objective:    BP 114/72   Pulse 69   Temp 97.8 F (36.6 C) (Oral)   Ht 5\' 3"  (1.6 m)   Wt 157 lb 6.4 oz (71.4 kg)   BMI 27.88 kg/m   Wt Readings from Last 3 Encounters:  09/18/16 157 lb 6.4 oz (71.4 kg)  08/26/16 155 lb (70.3 kg)  03/10/16 150 lb 9.6 oz (68.3 kg)    Gen: NAD, alert, cooperative with exam, NCAT EYES: EOMI, no conjunctival injection, or no icterus ENT:  L TM retracted, red, R slightly bulging, pink, injected, OP with mild erythema, R sided tonsillar exudates present LYMPH: small < 1cm cervical LAD CV: NRRR, normal S1/S2, no murmur Resp: CTABL, no wheezes, normal WOB Abd: +BS, soft, NTND. no guarding or organomegaly Ext: No edema, warm Neuro: Alert and oriented MSK: normal muscle bulk  Assessment & Plan:  Wynelle Linkesh was seen today for hoarse, sore throat and cough.  Diagnoses and all orders for this visit:  Acute URI Discussed symptomatic care  Acute otitis media, unspecified otitis media type If ear pressure worsens start below -     azithromycin (ZITHROMAX) 250 MG tablet; Take 2 the first day and then one each day after.  Sore throat Flu and strep test negative this morning Will send for culture -     Culture, Group A Strep   Follow up plan: prn Rex Krasarol Deaaron Fulghum,  MD [facility]

## 2016-09-22 LAB — CULTURE, GROUP A STREP

## 2016-12-23 ENCOUNTER — Encounter: Payer: Self-pay | Admitting: Pediatrics

## 2016-12-23 ENCOUNTER — Ambulatory Visit (INDEPENDENT_AMBULATORY_CARE_PROVIDER_SITE_OTHER): Payer: PRIVATE HEALTH INSURANCE | Admitting: Pediatrics

## 2016-12-23 VITALS — BP 120/79 | HR 76 | Temp 97.4°F | Ht 63.0 in | Wt 155.6 lb

## 2016-12-23 DIAGNOSIS — R5383 Other fatigue: Secondary | ICD-10-CM | POA: Diagnosis not present

## 2016-12-23 MED ORDER — NAPROXEN 500 MG PO TABS: 500.0000 mg | ORAL_TABLET | Freq: Two times a day (BID) | ORAL | 1 refills | Status: DC

## 2016-12-23 NOTE — Progress Notes (Signed)
  Subjective:   Patient ID: Connie Logan, female    DOB: [birth year], 36 y.o.   MRN: [id number] CC: Fatigue and Forgetfullness  HPI: Connie Logan is a 36 y.o. female presenting for Fatigue and Forgetfullness  Forgetting small things at work, having to write post-it notes Drinking sugar free red bull daily Going to bed around 10pm Up sometimes at night with 50mo baby goes back to sleep with bottle or soothing by mom Baby usually wakes up at 530, goes back to sleep for another 1-2 hours with bottle or soothing Pt feels tired in the morning, would sleep through alarm time by a lot if it wasn't set Doesn't wake up much at night Cries easily, with sad stories or commercials, gets over the sadness quickly Appetite is fine Has trouble concentrating at work Drinking light beer rarely, not daily Doesn't feel sad or depressed Feels more irritable than usual Does feel anxious all the time, husband's work stressful, he often is working nights Feels able to care for baby, 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 Allergies and medications reviewed and updated. History  Smoking Status  . Never Smoker  Smokeless Tobacco  . Never Used   ROS: Per HPI   Objective:    BP 120/79   Pulse 76   Temp 97.4 F (36.3 C) (Oral)   Ht '5\' 3"'$  (1.6 m)   Wt 155 lb 9.6 oz (70.6 kg)   BMI 27.56 kg/m   Wt Readings from Last 3 Encounters:  12/23/16 155 lb 9.6 oz (70.6 kg)  09/18/16 157 lb 6.4 oz (71.4 kg)  08/26/16 155 lb (70.3 kg)    Gen: NAD, alert, cooperative with exam, NCAT EYES: EOMI, no conjunctival injection, or no icterus ENT:  TMs pearly gray b/l, OP without erythema LYMPH:  no cervical LAD CV: NRRR, normal S1/S2, no murmur, distal pulses 2+ b/l Resp: CTABL, no wheezes, normal WOB Abd: +BS, soft, NTND. no guarding or organomegaly Ext: No edema, warm Neuro: Alert and oriented, strength equal b/l UE and LE, coordination grossly normal MSK: normal muscle bulk Psych: well groomed, normal affect, mood is "good"  Assessment & Plan:  [place] seen today for fatigue and forgetfullness.  Diagnoses and all orders for this visit:  Other fatigue Check below Consider ssri for mood disorder if symptoms continue Discussed, pt declines now Feels safe at home, mood is fine -     naproxen (NAPROSYN) 500 MG tablet; Take 1 tablet (500 mg total) by mouth 2 (two) times daily with a meal. -     CMP14+EGFR -     CBC with Differential/Platelet -     TSH -     Anemia Profile B   Follow up plan: prn [place]

## 2016-12-24 LAB — CMP14+EGFR
ALBUMIN: 4.1 g/dL (ref 3.5–5.5)
ALT: 11 IU/L (ref 0–32)
AST: 18 IU/L (ref 0–40)
Albumin/Globulin Ratio: 1.4 (ref 1.2–2.2)
Alkaline Phosphatase: 70 IU/L (ref 39–117)
BUN / CREAT RATIO: 11 (ref 9–23)
BUN: 9 mg/dL (ref 6–20)
Bilirubin Total: <0.2 mg/dL (ref 0.0–1.2)
CALCIUM: 8.6 mg/dL — AB (ref 8.7–10.2)
CO2: 24 mmol/L (ref 18–29)
CREATININE: 0.81 mg/dL (ref 0.57–1.00)
Chloride: 100 mmol/L (ref 96–106)
GFR, EST AFRICAN AMERICAN: 110 mL/min/{1.73_m2} (ref >59)
GFR, EST NON AFRICAN AMERICAN: 96 mL/min/{1.73_m2} (ref >59)
GLOBULIN, TOTAL: 2.9 g/dL (ref 1.5–4.5)
Glucose: 79 mg/dL (ref 65–99)
Potassium: 4.2 mmol/L (ref 3.5–5.2)
SODIUM: 139 mmol/L (ref 134–144)
Total Protein: 7 g/dL (ref 6.0–8.5)

## 2016-12-24 LAB — ANEMIA PROFILE B
BASOS ABS: 0 10*3/uL (ref 0.0–0.2)
Basos: 0 %
EOS (ABSOLUTE): 0.1 10*3/uL (ref 0.0–0.4)
Eos: 2 %
FOLATE: 10.7 ng/mL (ref >3.0)
Ferritin: 29 ng/mL (ref 15–150)
Hematocrit: 40.9 % (ref 34.0–46.6)
Hemoglobin: 13.9 g/dL (ref 11.1–15.9)
IRON: 70 ug/dL (ref 27–159)
Immature Grans (Abs): 0 10*3/uL (ref 0.0–0.1)
Immature Granulocytes: 0 %
Iron Saturation: 19 % (ref 15–55)
LYMPHS ABS: 2.1 10*3/uL (ref 0.7–3.1)
Lymphs: 30 %
MCH: 29.8 pg (ref 26.6–33.0)
MCHC: 34 g/dL (ref 31.5–35.7)
MCV: 88 fL (ref 79–97)
Monocytes Absolute: 0.4 10*3/uL (ref 0.1–0.9)
Monocytes: 6 %
NEUTROS ABS: 4.3 10*3/uL (ref 1.4–7.0)
Neutrophils: 62 %
PLATELETS: 304 10*3/uL (ref 150–379)
RBC: 4.67 x10E6/uL (ref 3.77–5.28)
RDW: 13 % (ref 12.3–15.4)
Retic Ct Pct: 1.4 % (ref 0.6–2.6)
Total Iron Binding Capacity: 362 ug/dL (ref 250–450)
UIBC: 292 ug/dL (ref 131–425)
VITAMIN B 12: 354 pg/mL (ref 232–1245)
WBC: 6.9 10*3/uL (ref 3.4–10.8)

## 2016-12-24 LAB — TSH: TSH: 1.58 u[IU]/mL (ref 0.450–4.500)

## 2017-01-26 ENCOUNTER — Other Ambulatory Visit: Payer: Self-pay | Admitting: *Deleted

## 2017-01-26 DIAGNOSIS — R5383 Other fatigue: Secondary | ICD-10-CM

## 2017-01-26 MED ORDER — NAPROXEN 500 MG PO TABS: 500.0000 mg | ORAL_TABLET | Freq: Two times a day (BID) | ORAL | 1 refills | Status: DC

## 2017-01-27 ENCOUNTER — Other Ambulatory Visit: Payer: Self-pay

## 2017-01-27 DIAGNOSIS — R5383 Other fatigue: Secondary | ICD-10-CM

## 2017-01-27 MED ORDER — NAPROXEN 500 MG PO TABS
500.0000 mg | ORAL_TABLET | Freq: Two times a day (BID) | ORAL | 0 refills | Status: DC
Start: 2017-01-27 — End: 2018-02-14

## 2017-02-05 ENCOUNTER — Other Ambulatory Visit: Payer: Self-pay

## 2017-02-05 MED ORDER — FLUCONAZOLE 150 MG PO TABS: 150.0000 mg | ORAL_TABLET | Freq: Once | ORAL | 0 refills | Status: AC

## 2017-09-08 ENCOUNTER — Ambulatory Visit (INDEPENDENT_AMBULATORY_CARE_PROVIDER_SITE_OTHER): Payer: PRIVATE HEALTH INSURANCE | Admitting: *Deleted

## 2017-09-08 DIAGNOSIS — Z23 Encounter for immunization: Secondary | ICD-10-CM

## 2018-02-02 ENCOUNTER — Encounter: Payer: Self-pay | Admitting: Family Medicine

## 2018-02-02 ENCOUNTER — Ambulatory Visit (INDEPENDENT_AMBULATORY_CARE_PROVIDER_SITE_OTHER): Payer: PRIVATE HEALTH INSURANCE | Admitting: Family Medicine

## 2018-02-02 VITALS — BP 116/77 | HR 62 | Temp 96.7°F | Ht 63.0 in | Wt 155.0 lb

## 2018-02-02 DIAGNOSIS — R600 Localized edema: Secondary | ICD-10-CM | POA: Diagnosis not present

## 2018-02-02 NOTE — Progress Notes (Signed)
Subjective: CC: LLE edema PCP: Eustaquio Maize, MD FFM:BWGY Connie Logan is a 37 y.o. female presenting to clinic today for:  1. LLE edema Patient reports that she developed left lower extremity edema when she was at the beach over the weekend.  She notes that she had significant swelling in the left lower extremity such that a reminder for being pregnant.  She notes some discomfort secondary to edema  She reports that she had been walking around on the sand and edema seem to occur after that.  Denies any calf pain or pain with walking.  Denies any increased warmth, discoloration.  She notes that she has chronic left knee issues but this has not seemed to bother her very much lately.  No increased NSAID use, excessive salt intake, preceding injury, bleeding or known anemia.  Denies any chest pain, shortness of breath, heart palpitations, orthopnea.  Right lower extremity does swell some but not as much as the left lower extremity.   ROS: Per HPI  Allergies  Allergen Reactions  . Septra [Sulfamethoxazole-Trimethoprim] Rash   Past Medical History:  Diagnosis Date  . GERD (gastroesophageal reflux disease)   . Gestational edema, postpartum 08/27/2015  . Headache    migraines  . Kidney stones     Current Outpatient Medications:  .  acetaminophen (TYLENOL) 500 MG tablet, Take 500 mg by mouth every 6 (six) hours as needed for mild pain., Disp: , Rfl:  .  ibuprofen (ADVIL,MOTRIN) 600 MG tablet, Take 1 tablet (600 mg total) by mouth every 6 (six) hours., Disp: 30 tablet, Rfl: 0 .  naproxen (NAPROSYN) 500 MG tablet, Take 1 tablet (500 mg total) by mouth 2 (two) times daily with a meal., Disp: 180 tablet, Rfl: 0 .  TRI-LO-SPRINTEC 0.18/0.215/0.25 MG-25 MCG tab, Take 1 tablet by mouth daily., Disp: , Rfl: 3 Social History   Socioeconomic History  . Marital status: Married    Spouse name: Not on file  . Number of children: Not on file  . Years of education: Not on file  . Highest education  level: Not on file  Occupational History  . Not on file  Social Needs  . Financial resource strain: Not on file  . Food insecurity:    Worry: Not on file    Inability: Not on file  . Transportation needs:    Medical: Not on file    Non-medical: Not on file  Tobacco Use  . Smoking status: Never Smoker  . Smokeless tobacco: Never Used  Substance and Sexual Activity  . Alcohol use: Yes    Comment: occ  . Drug use: No  . Sexual activity: Not on file  Lifestyle  . Physical activity:    Days per week: Not on file    Minutes per session: Not on file  . Stress: Not on file  Relationships  . Social connections:    Talks on phone: Not on file    Gets together: Not on file    Attends religious service: Not on file    Active member of club or organization: Not on file    Attends meetings of clubs or organizations: Not on file    Relationship status: Not on file  . Intimate partner violence:    Fear of current or ex partner: Not on file    Emotionally abused: Not on file    Physically abused: Not on file    Forced sexual activity: Not on file  Other Topics Concern  .  Not on file  Social History Narrative  . Not on file   Family History  Problem Relation Age of Onset  . Hypertension Mother   . Cancer Mother   . Cancer Maternal Grandmother     Objective: Office vital signs reviewed. BP 116/77   Pulse 62   Temp (!) 96.7 F (35.9 C) (Oral)   Ht _0  (1.6 m)   Wt 155 lb (70.3 kg)   BMI 27.46 kg/m   Physical Examination:  General: Awake, alert, well nourished, well appearing, No acute distress HEENT: Normal    Neck: No masses palpated. No lymphadenopathy, no JVD. Cardio: regular rate and rhythm, S1S2 heard, no murmurs appreciated Pulm: clear to auscultation bilaterally, no wheezes, rhonchi or rales; normal work of breathing on room air Extremities: warm, well perfused, moderate nonpitting edema noted L>R.  Left calf girth 45 cm, Right calf girth 41cm. No cyanosis or  clubbing; +2 pulses bilaterally; negative Homans sign.  No palpable cord.  No tenderness to palpation over the deep venous system within the calves. MSK: normal gait and normal station  Left foot: Negative squeeze test.  Patient has full active range of motion.  No tenderness to palpation along the metatarsals.  No discoloration. Skin: dry; intact; no rashes or lesions Neuro: Light touch sensation grossly intact.  Assessment/ Plan: 37 y.o. female   1. Edema of left lower extremity Possibly dependent edema given chronic knee issues and recent ambulation on the beach.  Her right lower extremity with some non-pitting edema as well.  No evidence of fluid overload otherwise.  Nothing on her exam to suggest a stress fracture.  Cannot rule out DVT at this time given discrepancy in calf girth.  There is about a 4 cm difference between her left and right calf.  Nothing else on her exam to suggest acute DVT.  The waxing and waning of her symptoms is reassuring but with Wells score of 1 for calf girth, will obtain d-dimer to further evaluate.  We will also check CMP to evaluate renal function, electrolytes and protein.  Check CBC to rule out anemia as etiology.  Patient aware of reasons for emergent evaluation the emergency department.  Will discuss results of labs once available. - D-dimer, quantitative (not at [facility]); Future - CMP14+EGFR; Future - CBC with Differential; Future   Orders Placed This Encounter  Procedures  . D-dimer, quantitative (not at [facility])    Standing Status:   Future    Standing Expiration Date:   02/03/2019  . CMP14+EGFR    Standing Status:   Future    Standing Expiration Date:   02/03/2019  . CBC with Differential    Standing Status:   Future    Standing Expiration Date:   02/03/2019    Janora Norlander, [place] [phone]

## 2018-02-03 LAB — CBC WITH DIFFERENTIAL/PLATELET
BASOS: 0 %
Basophils Absolute: 0 10*3/uL (ref 0.0–0.2)
EOS (ABSOLUTE): 0.2 10*3/uL (ref 0.0–0.4)
Eos: 2 %
HEMATOCRIT: 38.7 % (ref 34.0–46.6)
HEMOGLOBIN: 13.2 g/dL (ref 11.1–15.9)
Immature Grans (Abs): 0 10*3/uL (ref 0.0–0.1)
Immature Granulocytes: 0 %
Lymphocytes Absolute: 2.5 10*3/uL (ref 0.7–3.1)
Lymphs: 34 %
MCH: 29.6 pg (ref 26.6–33.0)
MCHC: 34.1 g/dL (ref 31.5–35.7)
MCV: 87 fL (ref 79–97)
Monocytes Absolute: 0.6 10*3/uL (ref 0.1–0.9)
Monocytes: 8 %
NEUTROS ABS: 4.2 10*3/uL (ref 1.4–7.0)
NEUTROS PCT: 56 %
Platelets: 323 10*3/uL (ref 150–379)
RBC: 4.46 x10E6/uL (ref 3.77–5.28)
RDW: 13.3 % (ref 12.3–15.4)
WBC: 7.5 10*3/uL (ref 3.4–10.8)

## 2018-02-03 LAB — CMP14+EGFR
A/G RATIO: 1.3 (ref 1.2–2.2)
ALBUMIN: 3.8 g/dL (ref 3.5–5.5)
ALK PHOS: 59 IU/L (ref 39–117)
ALT: 11 IU/L (ref 0–32)
AST: 17 IU/L (ref 0–40)
BUN / CREAT RATIO: 12 (ref 9–23)
BUN: 9 mg/dL (ref 6–20)
Bilirubin Total: 0.3 mg/dL (ref 0.0–1.2)
CO2: 21 mmol/L (ref 20–29)
CREATININE: 0.76 mg/dL (ref 0.57–1.00)
Calcium: 8.8 mg/dL (ref 8.7–10.2)
Chloride: 104 mmol/L (ref 96–106)
GFR calc Af Amer: 118 mL/min/{1.73_m2} (ref >59)
GFR calc non Af Amer: 103 mL/min/{1.73_m2} (ref >59)
GLOBULIN, TOTAL: 3 g/dL (ref 1.5–4.5)
Glucose: 80 mg/dL (ref 65–99)
Potassium: 4.3 mmol/L (ref 3.5–5.2)
SODIUM: 137 mmol/L (ref 134–144)
Total Protein: 6.8 g/dL (ref 6.0–8.5)

## 2018-02-03 LAB — D-DIMER, QUANTITATIVE: D-DIMER: 0.33 mg/L FEU (ref 0.00–0.49)

## 2018-02-03 NOTE — Addendum Note (Signed)
Addended by: Orma Render F on: 02/03/2018 09:26 AM   Modules accepted: Orders

## 2018-02-14 ENCOUNTER — Other Ambulatory Visit: Payer: Self-pay | Admitting: *Deleted

## 2018-02-14 ENCOUNTER — Ambulatory Visit (INDEPENDENT_AMBULATORY_CARE_PROVIDER_SITE_OTHER): Payer: PRIVATE HEALTH INSURANCE

## 2018-02-14 ENCOUNTER — Ambulatory Visit (INDEPENDENT_AMBULATORY_CARE_PROVIDER_SITE_OTHER): Payer: PRIVATE HEALTH INSURANCE | Admitting: Pediatrics

## 2018-02-14 ENCOUNTER — Encounter: Payer: Self-pay | Admitting: Pediatrics

## 2018-02-14 VITALS — BP 117/79 | HR 65 | Temp 98.2°F | Ht 63.0 in | Wt 155.0 lb

## 2018-02-14 DIAGNOSIS — M25532 Pain in left wrist: Secondary | ICD-10-CM

## 2018-02-14 DIAGNOSIS — M654 Radial styloid tenosynovitis [de Quervain]: Secondary | ICD-10-CM

## 2018-02-14 NOTE — Progress Notes (Signed)
  Subjective:   Patient ID: Connie Logan, female    DOB: Feb 14, 1981, 37 y.o.   MRN: [id number] CC: Wrist Pain  HPI: Connie Logan is a 37 y.o. female   Has had pain in left wrist worsening over the last month.  Repetitive actions such as drawing blood, lifting her 64-year-old seem to make it worse.  Injured her wrist when she was a child, no injury since then.  Has been taking naproxen, 1 over-the-counter pill twice a day, minimal help.  No swelling, general tenderness in the radial side of wrist.  Having her thumb sometimes hurts.  Patient is left-handed.  Relevant past medical, surgical, family and social history reviewed. Allergies and medications reviewed and updated. Social History   Tobacco Use  Smoking Status Never Smoker  Smokeless Tobacco Never Used   ROS: Per HPI   Objective:    BP 117/79   Pulse 65   Temp 98.2 F (36.8 C) (Oral)   Ht  (1.6 m)   Wt 155 lb (70.3 kg)   BMI 27.46 kg/m   Wt Readings from Last 3 Encounters:  02/14/18 155 lb (70.3 kg)  02/02/18 155 lb (70.3 kg)  12/23/16 155 lb 9.6 oz (70.6 kg)   Gen: NAD, alert, cooperative with exam, NCAT CV: distal pulses 2+ b/l Resp: normal WOB Neuro: Alert and oriented, strength equal b/l UE and LE, coordination grossly normal MSK: Normal range of motion left wrist.  No pain wrist range of motion against resistance.  Positive Finkelstein's left wrist.  Mild tenderness along radial head of wrist.  Left wrist x-ray: " FINDINGS: There is no evidence of fracture or dislocation. There is no evidence of arthropathy or other focal bone abnormality. Soft tissues are unremarkable.  IMPRESSION: No acute osseous injury of the left wrist."  Assessment & Plan:  Connie Logan was seen today for wrist pain.  Diagnoses and all orders for this visit:  Tenosynovitis, de Quervain NSAIDs, rest, ice.  Gave gentle stretching and rehab exercises.  If not improving will refer to sports medicine, consider steroid  injection.  Follow up plan: As needed Rex Kras, MD [place] [facility]

## 2018-08-18 ENCOUNTER — Ambulatory Visit (INDEPENDENT_AMBULATORY_CARE_PROVIDER_SITE_OTHER): Payer: PRIVATE HEALTH INSURANCE

## 2018-08-18 DIAGNOSIS — Z23 Encounter for immunization: Secondary | ICD-10-CM

## 2019-08-10 ENCOUNTER — Ambulatory Visit (INDEPENDENT_AMBULATORY_CARE_PROVIDER_SITE_OTHER): Payer: PRIVATE HEALTH INSURANCE

## 2019-08-10 DIAGNOSIS — Z23 Encounter for immunization: Secondary | ICD-10-CM

## 2019-08-18 ENCOUNTER — Encounter: Payer: Self-pay | Admitting: Family Medicine

## 2019-08-18 ENCOUNTER — Ambulatory Visit (INDEPENDENT_AMBULATORY_CARE_PROVIDER_SITE_OTHER): Payer: PRIVATE HEALTH INSURANCE

## 2019-08-18 ENCOUNTER — Other Ambulatory Visit: Payer: Self-pay | Admitting: Family Medicine

## 2019-08-18 ENCOUNTER — Other Ambulatory Visit: Payer: Self-pay

## 2019-08-18 ENCOUNTER — Ambulatory Visit (INDEPENDENT_AMBULATORY_CARE_PROVIDER_SITE_OTHER): Payer: PRIVATE HEALTH INSURANCE | Admitting: Family Medicine

## 2019-08-18 VITALS — BP 117/79 | HR 84 | Temp 98.1°F | Ht 63.0 in | Wt 172.0 lb

## 2019-08-18 DIAGNOSIS — Z0001 Encounter for general adult medical examination with abnormal findings: Secondary | ICD-10-CM | POA: Diagnosis not present

## 2019-08-18 DIAGNOSIS — G8929 Other chronic pain: Secondary | ICD-10-CM

## 2019-08-18 DIAGNOSIS — M25562 Pain in left knee: Secondary | ICD-10-CM

## 2019-08-18 DIAGNOSIS — M25561 Pain in right knee: Secondary | ICD-10-CM

## 2019-08-18 DIAGNOSIS — K219 Gastro-esophageal reflux disease without esophagitis: Secondary | ICD-10-CM | POA: Diagnosis not present

## 2019-08-18 DIAGNOSIS — N809 Endometriosis, unspecified: Secondary | ICD-10-CM

## 2019-08-18 DIAGNOSIS — Z0000 Encounter for general adult medical examination without abnormal findings: Secondary | ICD-10-CM

## 2019-08-18 DIAGNOSIS — E669 Obesity, unspecified: Secondary | ICD-10-CM

## 2019-08-18 MED ORDER — PANTOPRAZOLE SODIUM 40 MG PO TBEC: 40.0000 mg | DELAYED_RELEASE_TABLET | Freq: Every day | ORAL | 1 refills | Status: DC

## 2019-08-18 NOTE — Progress Notes (Signed)
Connie Logan is a 38 y.o. female presents to office today for annual physical exam examination.    Concerns today include: 1.  Bilateral knee pain Patient reports longstanding history of bilateral knee pain.  She points to the anterior aspect of the knees as a source of pain.  Pain is not modified by any particular movements.  She had a small knee injury on the left before but this did not require any surgical intervention.  She denies any exacerbation of pain or relief of pain with walking, standing, sitting.  She notes she has never been able to sit cross ligated.  She does report improvement of aching pain with Aleve but she often will wait until pain is unbearable to take this.  She does feel that the knees swell during pain flares.  Denies any discoloration or increased warmth.  Denies any pain in any other joints.  No weakness or sensation changes.  2.  GERD Patient reports greater than 2-year history of acid reflux which is most prominent when she lies down.  She notes that symptoms were exacerbated during her pregnancy but she was symptom-free for a while after pregnancy.  Symptoms have started to flare become more severe over the last 6 months.  She denies any abdominal pain, nausea, vomiting, unplanned weight loss, blood in stool.  She uses Tums and Gaviscon with some relief.  She identifies having been heavier as something that is likely contributing.  She overall feels that she eats healthy.  Symptoms can be exacerbated by even water.  Denies any coughing, wheezing or shortness of breath.  Occupation: Charity fundraiser, Marital status: Married, Substance use: None Diet: Fairly balanced, typical American, Exercise: No regular structured but she does try to stay physically active outdoors Last pap smear: Due in February.  She sees Dr. Ronita Hipps with OB/GYN Refills needed today: n/a Immunizations needed: Up-to-date  Past Medical History:  Diagnosis Date  . GERD (gastroesophageal reflux  disease)   . Gestational edema, postpartum 08/27/2015  . Headache    migraines  . Kidney stones    Social History   Socioeconomic History  . Marital status: Married    Spouse name: Not on file  . Number of children: Not on file  . Years of education: Not on file  . Highest education level: Not on file  Occupational History  . Not on file  Social Needs  . Financial resource strain: Not on file  . Food insecurity    Worry: Not on file    Inability: Not on file  . Transportation needs    Medical: Not on file    Non-medical: Not on file  Tobacco Use  . Smoking status: Never Smoker  . Smokeless tobacco: Never Used  Substance and Sexual Activity  . Alcohol use: Yes    Comment: occ  . Drug use: No  . Sexual activity: Yes    Birth control/protection: Pill  Lifestyle  . Physical activity    Days per week: Not on file    Minutes per session: Not on file  . Stress: Not on file  Relationships  . Social Herbalist on phone: Not on file    Gets together: Not on file    Attends religious service: Not on file    Active member of club or organization: Not on file    Attends meetings of clubs or organizations: Not on file    Relationship status: Not on file  . Intimate partner violence  Fear of current or ex partner: Not on file    Emotionally abused: Not on file    Physically abused: Not on file    Forced sexual activity: Not on file  Other Topics Concern  . Not on file  Social History Narrative  . Not on file   Past Surgical History:  Procedure Laterality Date  . CESAREAN SECTION N/A 08/24/2015   Procedure: CESAREAN SECTION;  Surgeon: Brien Few, MD;  Location: [facility];  Service: Obstetrics;  Laterality: N/A;  . ROBOTIC ASSISTED DIAGNOSTIC LAPAROSCOPY N/A 10/11/2014   Procedure: ROBOTIC ASSISTED DIAGNOSTIC LAPAROSCOPY EXCISION/ABLATION OF ENDOMETRIOSIS ;  Surgeon: Lovenia Kim, MD;  Location: [facility];  Service: Gynecology;  Laterality: N/A;  . WISDOM  TOOTH EXTRACTION     Family History  Problem Relation Age of Onset  . Hypertension Mother   . Cancer Mother   . Cancer Maternal Grandmother   . Diabetes Father     Current Outpatient Medications:  .  Levonorgestrel-Ethinyl Estradiol (AMETHIA,CAMRESE) 0.15-0.03 &0.01 MG tablet, Take 1 tablet by mouth daily., Disp: , Rfl: 4  Allergies  Allergen Reactions  . Septra [Sulfamethoxazole-Trimethoprim] Rash     ROS: Review of Systems Constitutional: negative Eyes: negative Ears, nose, mouth, throat, and face: negative Respiratory: negative Cardiovascular: negative Gastrointestinal: positive for dyspepsia Genitourinary:negative Integument/breast: negative Hematologic/lymphatic: negative Musculoskeletal:positive for arthralgias Neurological: negative Behavioral/Psych: negative Endocrine: negative Allergic/Immunologic: negative    Physical exam BP 117/79   Pulse 84   Temp 98.1 F (36.7 C) (Temporal)   Ht _0  (1.6 m)   Wt 172 lb (78 kg)   LMP 05/26/2019 (Exact Date)   SpO2 98%   BMI 30.47 kg/m  General appearance: alert, cooperative, appears stated age and no distress Head: Normocephalic, without obvious abnormality, atraumatic Eyes: negative findings: lids and lashes normal, conjunctivae and sclerae normal, corneas clear and pupils equal, round, reactive to light and accomodation Ears: normal TM's and external ear canals both ears Nose: Nares normal. Septum midline. Mucosa normal. No drainage or sinus tenderness. Throat: lips, mucosa, and tongue normal; teeth and gums normal; no cobblestone appearance noted in the oropharynx.  No erythema. Neck: no adenopathy, no carotid bruit, supple, symmetrical, trachea midline and thyroid not enlarged, symmetric, no tenderness/mass/nodules Back: symmetric, no curvature. ROM normal. No CVA tenderness. Lungs: clear to auscultation bilaterally Heart: regular rate and rhythm, S1, S2 normal, no murmur, click, rub or gallop Abdomen: soft,  non-tender; bowel sounds normal; no masses,  no organomegaly; no epigastric tenderness to palpation. Extremities: extremities normal, atraumatic, no cyanosis or edema Pulses: 2+ and symmetric Skin: Skin color, texture, turgor normal. No rashes or lesions Lymph nodes: Cervical, supraclavicular, and axillary nodes normal. Neurologic: Alert and oriented X 3, normal strength and tone. Normal symmetric reflexes. Normal coordination and gait Psych: Mood stable, speech normal, affect appropriate, pleasant and interactive MSK:   Knees: She has full active range of motion.  No gross joint effusion or soft tissue swelling noted.  No redness or warmth.  No tenderness palpation to the patella, patellar tendon, quad tendon.  She has tenderness to palpation anteriorly over both the medial and lateral joint lines bilaterally.  There are no palpable bony abnormalities.  She has prominent tibial tuberosity bilaterally but no tenderness over this area.  No ligamentous laxity.  Mild discomfort with Thessaly bilaterally.  Assessment/ Plan: DAILAH OPPERMAN here for annual physical exam.  Today we addressed the 2 nonpreventative items including chronic pain of bilateral knees and GERD.  1. Annual  physical exam GYN exam deferred to her OB/GYN  2. Chronic pain of both knees I have a high suspicion this is related to gait.  She may have a mild component of degenerative meniscal injury as well given pain with Thessaly.  Likely symptoms are relieved by Aleve.  I have gone ahead and obtain standing x-rays as well as other views to further evaluate the knee.  I placed a referral to sports medicine for consideration of orthotics.  We will also check out additional labs for completion to rule out other etiologies. - Arthritis Panel - ANA w/Reflex if Positive - Ambulatory referral to Sports Medicine  3. Obesity (BMI 30-39.9) She is working on lifestyle modification.  Check thyroid, fasting lipid panel. - TSH - CMP14+EGFR -  Lipid Panel  4. Endometriosis Managed by her OB/GYN.  CBC will be a part of the arthritis panel.  Continue OCP as directed  5. Gastroesophageal reflux disease without esophagitis Trial of PPI x2 weeks.  If symptoms totally resolve no need to restart.  However, we discussed that this may be restarted and continued chronically.  There are possible side effects of the medication which were outlined in her AVS today.  She will follow-up as needed on this issue. - pantoprazole (PROTONIX) 40 MG tablet; Take 1 tablet (40 mg total) by mouth daily. x2 weeks.  Then trial off.  May resume if symptoms return.  Dispense: 30 tablet; Refill: 1   Handout on healthy lifestyle choices, including diet (rich in fruits, vegetables and lean meats and low in salt and simple carbohydrates) and exercise (at least 30 minutes of moderate physical activity daily).  Patient to follow up in 1 year for annual exam or sooner if needed.  Arben Packman M. Lajuana Ripple, DO

## 2019-08-18 NOTE — Patient Instructions (Addendum)
You had labs performed today.  You will be contacted with the results of the labs once they are available, usually in the next 3 business days for routine lab work.  If you have an active my chart account, they will be released to your MyChart.  If you prefer to have these labs released to you via telephone, please let us know.  If you had a pap smear or biopsy performed, expect to be contacted in about 7-10 days.  Paternal trial of Protonix.  Take 1 tablet every morning before breakfast for the next 2 weeks.  After 2 weeks, can consider trial after of medicine.  However, if symptoms return, okay to start daily again.  The main risk with daily use of PPIs include increased risk of certain GI infections like C. difficile, decreased ability to absorb vitamin D and calcium ultimately leading to weak bones.   Preventive Care 9-23 Years Old, Female Preventive care refers to visits with your health care provider and lifestyle choices that can promote health and wellness. This includes:  A yearly physical exam. This may also be called an annual well check.  Regular dental visits and eye exams.  Immunizations.  Screening for certain conditions.  Healthy lifestyle choices, such as eating a healthy diet, getting regular exercise, not using drugs or products that contain nicotine and tobacco, and limiting alcohol use. What can I expect for my preventive care visit? Physical exam Your health care provider will check your:  Height and weight. This may be used to calculate body mass index (BMI), which tells if you are at a healthy weight.  Heart rate and blood pressure.  Skin for abnormal spots. Counseling Your health care provider may ask you questions about your:  Alcohol, tobacco, and drug use.  Emotional well-being.  Home and relationship well-being.  Sexual activity.  Eating habits.  Work and work Statistician.  Method of birth control.  Menstrual cycle.  Pregnancy  history. What immunizations do I need?  Influenza (flu) vaccine  This is recommended every year. Tetanus, diphtheria, and pertussis (Tdap) vaccine  You may need a Td booster every 10 years. Varicella (chickenpox) vaccine  You may need this if you have not been vaccinated. Human papillomavirus (HPV) vaccine  If recommended by your health care provider, you may need three doses over 6 months. Measles, mumps, and rubella (MMR) vaccine  You may need at least one dose of MMR. You may also need a second dose. Meningococcal conjugate (MenACWY) vaccine  One dose is recommended if you are age 88-21 years and a first-year college student living in a residence hall, or if you have one of several medical conditions. You may also need additional booster doses. Pneumococcal conjugate (PCV13) vaccine  You may need this if you have certain conditions and were not previously vaccinated. Pneumococcal polysaccharide (PPSV23) vaccine  You may need one or two doses if you smoke cigarettes or if you have certain conditions. Hepatitis A vaccine  You may need this if you have certain conditions or if you travel or work in places where you may be exposed to hepatitis A. Hepatitis B vaccine  You may need this if you have certain conditions or if you travel or work in places where you may be exposed to hepatitis B. Haemophilus influenzae type b (Hib) vaccine  You may need this if you have certain conditions. You may receive vaccines as individual doses or as more than one vaccine together in one shot (combination vaccines). Talk  with your health care provider about the risks and benefits of combination vaccines. What tests do I need?  Blood tests  Lipid and cholesterol levels. These may be checked every 5 years starting at age 97.  Hepatitis C test.  Hepatitis B test. Screening  Diabetes screening. This is done by checking your blood sugar (glucose) after you have not eaten for a while  (fasting).  Sexually transmitted disease (STD) testing.  BRCA-related cancer screening. This may be done if you have a family history of breast, ovarian, tubal, or peritoneal cancers.  Pelvic exam and Pap test. This may be done every 3 years starting at age 28. Starting at age 83, this may be done every 5 years if you have a Pap test in combination with an HPV test. Talk with your health care provider about your test results, treatment options, and if necessary, the need for more tests. Follow these instructions at home: Eating and drinking   Eat a diet that includes fresh fruits and vegetables, whole grains, lean protein, and low-fat dairy.  Take vitamin and mineral supplements as recommended by your health care provider.  Do not drink alcohol if: ? Your health care provider tells you not to drink. ? You are pregnant, may be pregnant, or are planning to become pregnant.  If you drink alcohol: ? Limit how much you have to 0-1 drink a day. ? Be aware of how much alcohol is in your drink. In the [place] one drink equals one 12 oz bottle of beer (355 mL), one 5 oz glass of wine (148 mL), or one 1 oz glass of hard liquor (44 mL). Lifestyle  Take daily care of your teeth and gums.  Stay active. Exercise for at least 30 minutes on 5 or more days each week.  Do not use any products that contain nicotine or tobacco, such as cigarettes, e-cigarettes, and chewing tobacco. If you need help quitting, ask your health care provider.  If you are sexually active, practice safe sex. Use a condom or other form of birth control (contraception) in order to prevent pregnancy and STIs (sexually transmitted infections). If you plan to become pregnant, see your health care provider for a preconception visit. What's next?  Visit your health care provider once a year for a well check visit.  Ask your health care provider how often you should have your eyes and teeth checked.  Stay up to date on all  vaccines. This information is not intended to replace advice given to you by your health care provider. Make sure you discuss any questions you have with your health care provider. Document Released: 11/10/2001 Document Revised: 05/26/2018 Document Reviewed: 05/26/2018 Elsevier Patient Education  2020 [employer].

## 2019-08-19 LAB — CMP14+EGFR
ALT: 20 IU/L (ref 0–32)
AST: 20 IU/L (ref 0–40)
Albumin/Globulin Ratio: 1.5 (ref 1.2–2.2)
Albumin: 4.4 g/dL (ref 3.8–4.8)
Alkaline Phosphatase: 71 IU/L (ref 39–117)
BUN/Creatinine Ratio: 12 (ref 9–23)
BUN: 10 mg/dL (ref 6–20)
Bilirubin Total: 0.3 mg/dL (ref 0.0–1.2)
CO2: 20 mmol/L (ref 20–29)
Calcium: 9 mg/dL (ref 8.7–10.2)
Chloride: 102 mmol/L (ref 96–106)
Creatinine, Ser: 0.82 mg/dL (ref 0.57–1.00)
GFR calc Af Amer: 106 mL/min/{1.73_m2} (ref >59)
GFR calc non Af Amer: 92 mL/min/{1.73_m2} (ref >59)
Globulin, Total: 3 g/dL (ref 1.5–4.5)
Glucose: 80 mg/dL (ref 65–99)
Potassium: 4.4 mmol/L (ref 3.5–5.2)
Sodium: 138 mmol/L (ref 134–144)
Total Protein: 7.4 g/dL (ref 6.0–8.5)

## 2019-08-19 LAB — ANA W/REFLEX IF POSITIVE
Anti JO-1: <0.2 AI (ref 0.0–0.9)
Anti Nuclear Antibody (ANA): POSITIVE — AB
Centromere Ab Screen: <0.2 AI (ref 0.0–0.9)
Chromatin Ab SerPl-aCnc: <0.2 AI (ref 0.0–0.9)
ENA RNP Ab: 1.1 AI — ABNORMAL HIGH (ref 0.0–0.9)
ENA SM Ab Ser-aCnc: <0.2 AI (ref 0.0–0.9)
ENA SSA (RO) Ab: 1.3 AI — ABNORMAL HIGH (ref 0.0–0.9)
ENA SSB (LA) Ab: 0.2 AI (ref 0.0–0.9)
Scleroderma (Scl-70) (ENA) Antibody, IgG: <0.2 AI (ref 0.0–0.9)
dsDNA Ab: 1 IU/mL (ref 0–9)

## 2019-08-19 LAB — ARTHRITIS PANEL
Basophils Absolute: 0 10*3/uL (ref 0.0–0.2)
Basos: 0 %
EOS (ABSOLUTE): 0.1 10*3/uL (ref 0.0–0.4)
Eos: 1 %
Hematocrit: 41.8 % (ref 34.0–46.6)
Hemoglobin: 14.1 g/dL (ref 11.1–15.9)
Immature Grans (Abs): 0 10*3/uL (ref 0.0–0.1)
Immature Granulocytes: 0 %
Lymphocytes Absolute: 2.2 10*3/uL (ref 0.7–3.1)
Lymphs: 29 %
MCH: 29.9 pg (ref 26.6–33.0)
MCHC: 33.7 g/dL (ref 31.5–35.7)
MCV: 89 fL (ref 79–97)
Monocytes Absolute: 0.5 10*3/uL (ref 0.1–0.9)
Monocytes: 6 %
Neutrophils Absolute: 4.9 10*3/uL (ref 1.4–7.0)
Neutrophils: 64 %
Platelets: 359 10*3/uL (ref 150–450)
RBC: 4.72 x10E6/uL (ref 3.77–5.28)
RDW: 12.6 % (ref 11.7–15.4)
Rhuematoid fact SerPl-aCnc: 10.8 IU/mL (ref 0.0–13.9)
Sed Rate: 6 mm/hr (ref 0–32)
Uric Acid: 4.7 mg/dL (ref 2.5–7.1)
WBC: 7.7 10*3/uL (ref 3.4–10.8)

## 2019-08-19 LAB — LIPID PANEL
Chol/HDL Ratio: 3.4 ratio (ref 0.0–4.4)
Cholesterol, Total: 209 mg/dL — ABNORMAL HIGH (ref 100–199)
HDL: 62 mg/dL (ref >39)
LDL Chol Calc (NIH): 120 mg/dL — ABNORMAL HIGH (ref 0–99)
Triglycerides: 156 mg/dL — ABNORMAL HIGH (ref 0–149)
VLDL Cholesterol Cal: 27 mg/dL (ref 5–40)

## 2019-08-19 LAB — TSH: TSH: 1.72 u[IU]/mL (ref 0.450–4.500)

## 2019-08-21 ENCOUNTER — Other Ambulatory Visit: Payer: Self-pay | Admitting: Family Medicine

## 2019-08-21 DIAGNOSIS — M2550 Pain in unspecified joint: Secondary | ICD-10-CM

## 2019-08-21 DIAGNOSIS — R768 Other specified abnormal immunological findings in serum: Secondary | ICD-10-CM

## 2019-09-12 ENCOUNTER — Other Ambulatory Visit: Payer: Self-pay | Admitting: Family Medicine

## 2019-09-12 DIAGNOSIS — K219 Gastro-esophageal reflux disease without esophagitis: Secondary | ICD-10-CM

## 2019-10-10 ENCOUNTER — Other Ambulatory Visit: Payer: Self-pay

## 2019-10-10 ENCOUNTER — Ambulatory Visit (INDEPENDENT_AMBULATORY_CARE_PROVIDER_SITE_OTHER): Payer: PRIVATE HEALTH INSURANCE | Admitting: Sports Medicine

## 2019-10-10 VITALS — BP 122/84 | Ht 63.0 in | Wt 170.0 lb

## 2019-10-10 DIAGNOSIS — M2242 Chondromalacia patellae, left knee: Secondary | ICD-10-CM

## 2019-10-10 DIAGNOSIS — M2241 Chondromalacia patellae, right knee: Secondary | ICD-10-CM

## 2019-10-10 MED ORDER — NAPROXEN 500 MG PO TABS: 500.0000 mg | ORAL_TABLET | Freq: Two times a day (BID) | ORAL | 2 refills | Status: DC | PRN

## 2019-10-11 ENCOUNTER — Encounter: Payer: Self-pay | Admitting: Sports Medicine

## 2019-10-11 NOTE — Progress Notes (Signed)
   Subjective:    Patient ID: Margorie John, female    DOB: Aug 16, 1981, 39 y.o.   MRN: [id number]  HPI chief complaint: Bilateral knee pain  Very pleasant 39 year old female comes in today complaining of chronic bilateral knee pain.  She states that she has had knee pain for 15+ years.  She denies any remote injury but rather describes a gradual onset of pain that is primarily in the anterior knee around the patella.  She was initially seen for left knee pain about 15 years ago and given a cortisone injection which did temporarily help.  She notices that her symptoms are worse after prolonged sitting.  She also has significant pain with squatting or lunging.  She does describe some popping in the knees bilaterally but no true locking.  Some mild intermittent swelling.  She notes that both heat and ice are helpful.  She recently purchased some new tennis shoes which have also been quite helpful.  No prior knee surgeries.  Pain will occasionally radiate up the lateral aspect of the right thigh to the hip.  No numbness or tingling.  She had x-rays done in November which are available for my review.  Past medical history reviewed Medications reviewed Allergies reviewed   Review of Systems    As above Objective:   Physical Exam  Well-developed, well-nourished.  No acute distress.  Awake alert and oriented x3.  Vital signs reviewed.  Examination of both knees shows full range of motion.  No obvious effusion.  Minimal patellofemoral crepitus.  There is pain with patellar compression bilaterally.  There is tethering of both patella laterally. Slight tenderness along the medial joint lines bilaterally as well.  Negative McMurray's.  Knees are stable to ligamentous exam.  Neurovascularly intact distally.  Evaluation of her feet in the standing position shows pes planus bilaterally.  This is corrected with her tennis shoes.  Limited bedside ultrasound of both knees shows no obvious knee effusion.   Visualized portion of the medial and lateral menisci are unremarkable.  X-rays from November 2020 are reviewed.  These are AP, lateral, and sunrise views.  Nothing acute is seen.  Patella seems to be well seated in the trochlear groove.  No obvious patellar tilt.      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 Pes planus  Patient has tethering of both patella on exam today.  Although she has had symptoms for many years I do think we should try a trial of conservative treatment, specifically physical therapy with a trial of McConnell taping.  Her tennis shoes correct her pes planus quite nicely and help with her knee pain.  She will wear the shoes as much as possible.  She will follow up with me again in 6 weeks for reevaluation.  I did discuss merits of MRI of her more symptomatic right knee if pain persists.  I also given her prescription for naproxen sodium to take 500 mg twice daily as needed.  She is warned about GI upset.  Follow-up with me again in 6 weeks but call with questions or concerns in the interim.

## 2019-10-19 ENCOUNTER — Encounter: Payer: Self-pay | Admitting: Physical Therapy

## 2019-10-19 ENCOUNTER — Other Ambulatory Visit: Payer: Self-pay

## 2019-10-19 ENCOUNTER — Ambulatory Visit: Payer: PRIVATE HEALTH INSURANCE | Attending: Sports Medicine | Admitting: Physical Therapy

## 2019-10-19 DIAGNOSIS — G8929 Other chronic pain: Secondary | ICD-10-CM

## 2019-10-19 DIAGNOSIS — M25561 Pain in right knee: Secondary | ICD-10-CM | POA: Diagnosis present

## 2019-10-19 NOTE — Therapy (Signed)
[facility] [address] [place], [place], [address] Phone: [phone]   Fax:  [phone]  Physical Therapy Evaluation  Patient Details  Name: Connie Logan MRN: [id number] Date of Birth: 10-16-1980 Referring Provider (PT): Reino Bellis DO.   Encounter Date: 10/19/2019  PT End of Session - 10/19/19 1546    Visit Number  1    Number of Visits  8    Date for PT Re-Evaluation  11/16/19    PT Start Time  0100    PT Stop Time  0132    PT Time Calculation (min)  32 min    Activity Tolerance  Patient tolerated treatment well    Behavior During Therapy  [facility] for tasks assessed/performed       Past Medical History:  Diagnosis Date  . GERD (gastroesophageal reflux disease)   . Gestational edema, postpartum 08/27/2015  . Headache    migraines  . Kidney stones     Past Surgical History:  Procedure Laterality Date  . CESAREAN SECTION N/A 08/24/2015   Procedure: CESAREAN SECTION;  Surgeon: Olivia Mackie, MD;  Location: [facility];  Service: Obstetrics;  Laterality: N/A;  . ROBOTIC ASSISTED DIAGNOSTIC LAPAROSCOPY N/A 10/11/2014   Procedure: ROBOTIC ASSISTED DIAGNOSTIC LAPAROSCOPY EXCISION/ABLATION OF ENDOMETRIOSIS ;  Surgeon: Lenoard Aden, MD;  Location: [facility];  Service: Gynecology;  Laterality: N/A;  . WISDOM TOOTH EXTRACTION      There were no vitals filed for this visit.   Subjective Assessment - 10/19/19 1337    Subjective  COVID-19 screen performed prior to patient entering clinic.  The patient presents to the clinic today with a CC of right knee pain.  She reports her left knee pain is a low 1/10 and has not been a problem for her.  Her right knee will wake her at night.  She is very limited with requires to day to day activities as her pain become severe.  the situation has been ongoing and worsening over the last year.  Stairs are very painful.    Limitations  Standing;Walking    How long can you walk comfortably?  Community  distances but painful.    Diagnostic tests  X-rays.    Patient Stated Goals  Get out of pain and get quality of life back (ie:  Play with child, exercise).    Currently in Pain?  Yes    Pain Score  8     Pain Location  Knee    Pain Orientation  Right    Pain Descriptors / Indicators  Aching;Sore;Throbbing;Shooting    Pain Type  Chronic pain    Pain Onset  More than a month ago    Pain Frequency  Constant    Aggravating Factors   "Anything"    Pain Relieving Factors  Advil, Biofreeze, knee brace.         [facility] PT Assessment - 10/19/19 0001      Assessment   Medical Diagnosis  Chondromalacia of both patellae.    Referring Provider (PT)  Reino Bellis DO.    Onset Date/Surgical Date  --   ~one year.     Precautions   Precautions  --   PAIN-FREE THER EX.     Restrictions   Weight Bearing Restrictions  No      Balance Screen   Has the patient fallen in the past 6 months  No    Has the patient had a decrease in activity level because of a fear of  falling?   No    Is the patient reluctant to leave their home because of a fear of falling?   No      Home Environment   Living Environment  Private residence      Prior Function   Level of Independence  Independent      Posture/Postural Control   Posture Comments  Patellae tend to sit laterally.      ROM / Strength   AROM / PROM / Strength  AROM;Strength      AROM   Overall AROM Comments  Full bilateral knee range of motion.        Strength   Overall Strength Comments  Essentially normal over major muscle groupd of right LE.      Flexibility   Soft Tissue Assessment /Muscle Length  yes    Hamstrings  RT= 47 degrees.      Palpation   Patella mobility  Very restricted in all direction of right patella.    Palpation comment  Pain "in" knee and c/o pain around periphery of right knee cap.      Special Tests   Other special tests  Essentially stable right knee joint.      Ambulation/Gait   Gait Comments  Essentially  normal gait cycle with patient wearing [employer] Balance 860's.                Objective measurements completed on examination: See above findings.                   PT Long Term Goals - 10/19/19 1545      PT LONG TERM GOAL #1   Title  Independent with a HEP.    Time  4    Period  Weeks    Status  New      PT LONG TERM GOAL #2   Title  Perform ADL's with pain not > 3/10.    Time  4    Period  Weeks    Status  New      PT LONG TERM GOAL #3   Title  Restore normal right patellar mobility.    Time  4    Period  Weeks    Status  New             Plan - 10/19/19 1508    Clinical Impression Statement  The patient presents to OPPT with a CC of at times severe right knee pain that will even wake her at night.  Most notable was a great loss of right patellar mobility (left was normal) in all directions and very tight hamstrings.  She reports pain around the periphery of her right kneecap.  Her pain has affected her quality of life such that she cannot play with her child like she wish she could and cannot exercise effectively.  Patient will benefit from skilled physical therapy intervention to address deficits and pain.    Examination-Activity Limitations  Stairs;Squat;Other    Examination-Participation Restrictions  Other    Stability/Clinical Decision Making  Evolving/Moderate complexity    Clinical Decision Making  Low    Rehab Potential  Good    PT Frequency  2x / week    PT Duration  4 weeks    PT Treatment/Interventions  ADLs/Self Care Home Management;Cryotherapy;Electrical Stimulation;Moist Heat;Ultrasound;Neuromuscular re-education;Patient/family education;Manual techniques;Passive range of motion;Taping;Vasopneumatic Device    PT Next Visit Plan  FOTO next visit.  Right patellar mobs.  Hamstring strengthening.  Pain-free stationary bike  and pain-free OKC/CKC right quad exercises. Can trying taping and VMS to right VMO.  Can try taping.    Consulted and  Agree with Plan of Care  Patient       Patient will benefit from skilled therapeutic intervention in order to improve the following deficits and impairments:  Pain, Decreased activity tolerance, Decreased range of motion  Visit Diagnosis: Chronic pain of right knee - Plan: PT plan of care cert/re-cert     Problem List Patient Active Problem List   Diagnosis Date Noted  . Gestational edema, postpartum 08/27/2015  . Postpartum care following cesarean delivery (11/26) 08/25/2015  . Acute epigastric pain 08/24/2015  . Severe preeclampsia 08/24/2015  . Preeclampsia, severe 08/24/2015    Yvette Loveless, [place] MPT 10/19/2019, 3:49 PM  [facility] [address] [place], [place], [address] Phone: [phone]   Fax:  [phone]  Name: MONEKA MCQUINN MRN: [id number] Date of Birth: 06/15/1981

## 2019-10-23 ENCOUNTER — Ambulatory Visit: Payer: PRIVATE HEALTH INSURANCE | Admitting: Physical Therapy

## 2019-10-23 ENCOUNTER — Encounter: Payer: Self-pay | Admitting: Physical Therapy

## 2019-10-23 ENCOUNTER — Other Ambulatory Visit: Payer: Self-pay

## 2019-10-23 DIAGNOSIS — G8929 Other chronic pain: Secondary | ICD-10-CM

## 2019-10-23 DIAGNOSIS — M25561 Pain in right knee: Secondary | ICD-10-CM | POA: Diagnosis not present

## 2019-10-23 NOTE — Therapy (Signed)
[facility] [place], [place], [address] Phone: [phone]   Fax:  [phone]  Physical Therapy Treatment  Patient Details  Name: Connie Logan MRN: [id number] Date of Birth: 06/04/1981 Referring Provider (PT): Lilia Argue DO.   Encounter Date: 10/23/2019  PT End of Session - 10/23/19 1411    Visit Number  2    Number of Visits  8    Date for PT Re-Evaluation  11/16/19    Authorization Type  FOTO    PT Start Time  1300    PT Stop Time  1352    PT Time Calculation (min)  52 min    Activity Tolerance  Patient tolerated treatment well    Behavior During Therapy  WFL for tasks assessed/performed       Past Medical History:  Diagnosis Date  . GERD (gastroesophageal reflux disease)   . Gestational edema, postpartum 08/27/2015  . Headache    migraines  . Kidney stones     Past Surgical History:  Procedure Laterality Date  . CESAREAN SECTION N/A 08/24/2015   Procedure: CESAREAN SECTION;  Surgeon: Brien Few, MD;  Location: [facility];  Service: Obstetrics;  Laterality: N/A;  . ROBOTIC ASSISTED DIAGNOSTIC LAPAROSCOPY N/A 10/11/2014   Procedure: ROBOTIC ASSISTED DIAGNOSTIC LAPAROSCOPY EXCISION/ABLATION OF ENDOMETRIOSIS ;  Surgeon: Lovenia Kim, MD;  Location: [facility];  Service: Gynecology;  Laterality: N/A;  . WISDOM TOOTH EXTRACTION      There were no vitals filed for this visit.  Subjective Assessment - 10/23/19 1306    Subjective  COVID-19 screening performed upon arrival. Patient arrives with 7/10 global right knee pain. Reports she did well over the weekend but then last night had an increase of pain.    Limitations  Standing;Walking    How long can you walk comfortably?  Community distances but painful.    Diagnostic tests  X-rays.    Patient Stated Goals  Get out of pain and get quality of life back (ie:  Play with child, exercise).    Currently in Pain?  Yes    Pain Score  7     Pain Location  Knee    Pain Orientation  Right    Pain Type  Chronic pain    Pain Onset  More than a month ago    Pain Frequency  Constant         [facility] PT Assessment - 10/23/19 0001      Assessment   Medical Diagnosis  Chondromalacia of both patellae.    Referring Provider (PT)  Lilia Argue DO.                   [facility] Adult PT Treatment/Exercise - 10/23/19 0001      Exercises   Exercises  Knee/Hip      Knee/Hip Exercises: Aerobic   Nustep  Level 3 x10 mins      Modalities   Modalities  Electrical Stimulation;Moist Heat      Moist Heat Therapy   Number Minutes Moist Heat  15 Minutes    Moist Heat Location  Knee      Electrical Stimulation   Electrical Stimulation Location  right knee    Electrical Stimulation Action  IFC    Electrical Stimulation Parameters  80-150 hz x15 mins    Electrical Stimulation Goals  Pain      Manual Therapy   Manual Therapy  Joint mobilization    Joint Mobilization  Patella joint mobilization  in all planes to improve mobility. intermittent oscillations to prevent muscle guarding.                  PT Long Term Goals - 10/19/19 1545      PT LONG TERM GOAL #1   Title  Independent with a HEP.    Time  4    Period  Weeks    Status  New      PT LONG TERM GOAL #2   Title  Perform ADL's with pain not > 3/10.    Time  4    Period  Weeks    Status  New      PT LONG TERM GOAL #3   Title  Restore normal right patellar mobility.    Time  4    Period  Weeks    Status  New            Plan - 10/23/19 1355    Clinical Impression Statement  Patient responded fairly well to therapy but with reports of increased pain with nustep. Patient noted with limited right patella mobility at start of session and improved with mobility but not to equal of left unaffected knee patella mobs. Patient noted with normal response to modalitiesupon removal    Examination-Activity Limitations  Stairs;Squat;Other    Examination-Participation Restrictions   Other    Stability/Clinical Decision Making  Evolving/Moderate complexity    Clinical Decision Making  Low    Rehab Potential  Good    PT Frequency  2x / week    PT Duration  4 weeks    PT Treatment/Interventions  ADLs/Self Care Home Management;Cryotherapy;Electrical Stimulation;Moist Heat;Ultrasound;Neuromuscular re-education;Patient/family education;Manual techniques;Passive range of motion;Taping;Vasopneumatic Device    PT Next Visit Plan  FOTO next visit, already set up.  Right patellar mobs.  Hamstring strengthening.  Pain-free stationary bike and pain-free OKC/CKC right quad exercises. Can trying taping and VMS to right VMO.  Can try taping.    Consulted and Agree with Plan of Care  Patient       Patient will benefit from skilled therapeutic intervention in order to improve the following deficits and impairments:  Pain, Decreased activity tolerance, Decreased range of motion  Visit Diagnosis: Chronic pain of right knee     Problem List Patient Active Problem List   Diagnosis Date Noted  . Gestational edema, postpartum 08/27/2015  . Postpartum care following cesarean delivery (11/26) 08/25/2015  . Acute epigastric pain 08/24/2015  . Severe preeclampsia 08/24/2015  . Preeclampsia, severe 08/24/2015    Guss Bunde, PT, DPT 10/23/2019, 5:16 PM  [facility] [address] [place], [place], [address] Phone: [phone]   Fax:  [phone]  Name: Connie Logan MRN: [id number] Date of Birth: 1981/07/03

## 2019-10-25 ENCOUNTER — Ambulatory Visit: Payer: PRIVATE HEALTH INSURANCE | Admitting: Physical Therapy

## 2019-10-25 ENCOUNTER — Other Ambulatory Visit: Payer: Self-pay

## 2019-10-25 DIAGNOSIS — G8929 Other chronic pain: Secondary | ICD-10-CM

## 2019-10-25 DIAGNOSIS — M25561 Pain in right knee: Secondary | ICD-10-CM | POA: Diagnosis not present

## 2019-10-25 NOTE — Therapy (Signed)
[facility] [address] [place], [place], [address] Phone: [phone]   Fax:  [phone]  Physical Therapy Treatment  Patient Details  Name: Connie Logan MRN: [id number] Date of Birth: 22-May-1981 Referring Provider (PT): Reino Bellis DO.   Encounter Date: 10/25/2019  PT End of Session - 10/25/19 1501    Visit Number  3    Number of Visits  8    Date for PT Re-Evaluation  11/16/19    Authorization Type  FOTO    PT Start Time  0104    PT Stop Time  0155    PT Time Calculation (min)  51 min       Past Medical History:  Diagnosis Date  . GERD (gastroesophageal reflux disease)   . Gestational edema, postpartum 08/27/2015  . Headache    migraines  . Kidney stones     Past Surgical History:  Procedure Laterality Date  . CESAREAN SECTION N/A 08/24/2015   Procedure: CESAREAN SECTION;  Surgeon: Olivia Mackie, MD;  Location: [facility];  Service: Obstetrics;  Laterality: N/A;  . ROBOTIC ASSISTED DIAGNOSTIC LAPAROSCOPY N/A 10/11/2014   Procedure: ROBOTIC ASSISTED DIAGNOSTIC LAPAROSCOPY EXCISION/ABLATION OF ENDOMETRIOSIS ;  Surgeon: Lenoard Aden, MD;  Location: [facility];  Service: Gynecology;  Laterality: N/A;  . WISDOM TOOTH EXTRACTION      There were no vitals filed for this visit.  Subjective Assessment - 10/25/19 1307    Subjective  COVID-19 screen performed prior to patient entering clinic.  Knee feels tender.    Limitations  Standing;Walking    How long can you walk comfortably?  Community distances but painful.    Diagnostic tests  X-rays.    Patient Stated Goals  Get out of pain and get quality of life back (ie:  Play with child, exercise).    Currently in Pain?  Yes    Pain Score  3     Pain Location  Knee    Pain Orientation  Right    Pain Descriptors / Indicators  Tender    Pain Type  Chronic pain    Pain Onset  More than a month ago                       [facility] Adult PT Treatment/Exercise -  10/25/19 0001      Exercises   Exercises  Knee/Hip      Knee/Hip Exercises: Aerobic   Recumbent Bike  Level 3 x 15 minutes.      Modalities   Modalities  Electrical Stimulation;Moist Heat      Moist Heat Therapy   Number Minutes Moist Heat  20 Minutes    Moist Heat Location  --   Right knee.     Programme researcher, broadcasting/film/video Location  Right knee.    Electrical Stimulation Action  IFC    Electrical Stimulation Parameters  80-150 Hz x 20 minutes.    Electrical Stimulation Goals  Pain      Manual Therapy   Manual Therapy  Joint mobilization    Joint Mobilization  In supine:  Right patellar mobs and sustained hamstring stretching x 8 minutes.                  PT Long Term Goals - 10/19/19 1545      PT LONG TERM GOAL #1   Title  Independent with a HEP.    Time  4    Period  Weeks  Status  New      PT LONG TERM GOAL #2   Title  Perform ADL's with pain not > 3/10.    Time  4    Period  Weeks    Status  New      PT LONG TERM GOAL #3   Title  Restore normal right patellar mobility.    Time  4    Period  Weeks    Status  New            Plan - 10/25/19 1453    Clinical Impression Statement  Patient with less pain today with right knee feeling tender today.  Patient already demonstrating a significant improvement in her right hamstring length.    Examination-Activity Limitations  Stairs;Squat;Other    Examination-Participation Restrictions  Other    Stability/Clinical Decision Making  Evolving/Moderate complexity    Rehab Potential  Good    PT Frequency  2x / week    PT Duration  4 weeks    PT Treatment/Interventions  ADLs/Self Care Home Management;Cryotherapy;Electrical Stimulation;Moist Heat;Ultrasound;Neuromuscular re-education;Patient/family education;Manual techniques;Passive range of motion;Taping;Vasopneumatic Device    PT Next Visit Plan  FOTO next visit, already set up.  Right patellar mobs.  Hamstring strengthening.   Pain-free stationary bike and pain-free OKC/CKC right quad exercises. Can trying taping and VMS to right VMO.  Can try taping.    Consulted and Agree with Plan of Care  Patient       Patient will benefit from skilled therapeutic intervention in order to improve the following deficits and impairments:  Pain, Decreased activity tolerance, Decreased range of motion  Visit Diagnosis: Chronic pain of right knee     Problem List Patient Active Problem List   Diagnosis Date Noted  . Gestational edema, postpartum 08/27/2015  . Postpartum care following cesarean delivery (11/26) 08/25/2015  . Acute epigastric pain 08/24/2015  . Severe preeclampsia 08/24/2015  . Preeclampsia, severe 08/24/2015    Victory Strollo, [place] MPT 10/25/2019, 3:12 PM  [facility] [address] [place], [place], [address] Phone: [phone]   Fax:  [phone]  Name: Connie Logan MRN: [id number] Date of Birth: 1981-01-26

## 2019-10-31 ENCOUNTER — Ambulatory Visit: Payer: No Typology Code available for payment source | Attending: Sports Medicine | Admitting: Physical Therapy

## 2019-10-31 ENCOUNTER — Other Ambulatory Visit: Payer: Self-pay

## 2019-10-31 ENCOUNTER — Encounter: Payer: Self-pay | Admitting: Physical Therapy

## 2019-10-31 DIAGNOSIS — M25561 Pain in right knee: Secondary | ICD-10-CM | POA: Insufficient documentation

## 2019-10-31 DIAGNOSIS — G8929 Other chronic pain: Secondary | ICD-10-CM | POA: Insufficient documentation

## 2019-10-31 NOTE — Therapy (Signed)
[facility] [place], [place], [address] Phone: [phone]   Fax:  [phone]  Physical Therapy Treatment  Patient Details  Name: Connie Logan MRN: [id number] Date of Birth: May 18, 1981 Referring Provider (PT): Lilia Argue DO.   Encounter Date: 10/31/2019  PT End of Session - 10/31/19 1417    Visit Number  4    Number of Visits  8    Date for PT Re-Evaluation  11/16/19    Authorization Type  FOTO    PT Start Time  0104    PT Stop Time  0155    PT Time Calculation (min)  51 min    Activity Tolerance  Patient tolerated treatment well    Behavior During Therapy  WFL for tasks assessed/performed       Past Medical History:  Diagnosis Date  . GERD (gastroesophageal reflux disease)   . Gestational edema, postpartum 08/27/2015  . Headache    migraines  . Kidney stones     Past Surgical History:  Procedure Laterality Date  . CESAREAN SECTION N/A 08/24/2015   Procedure: CESAREAN SECTION;  Surgeon: Brien Few, MD;  Location: [facility];  Service: Obstetrics;  Laterality: N/A;  . ROBOTIC ASSISTED DIAGNOSTIC LAPAROSCOPY N/A 10/11/2014   Procedure: ROBOTIC ASSISTED DIAGNOSTIC LAPAROSCOPY EXCISION/ABLATION OF ENDOMETRIOSIS ;  Surgeon: Lovenia Kim, MD;  Location: [facility];  Service: Gynecology;  Laterality: N/A;  . WISDOM TOOTH EXTRACTION      There were no vitals filed for this visit.  Subjective Assessment - 10/31/19 1411    Subjective  COVID-19 screen performed prior to patient entering clinic.  Knee feels okay today but saturday it hurt quite a bit.  Patient feels this could be related to cold weather.    Limitations  Standing;Walking    How long can you walk comfortably?  Community distances but painful.    Diagnostic tests  X-rays.    Patient Stated Goals  Get out of pain and get quality of life back (ie:  Play with child, exercise).    Currently in Pain?  Yes    Pain Score  3     Pain Location  Knee    Pain  Orientation  Right    Pain Descriptors / Indicators  Tender    Pain Type  Chronic pain    Pain Onset  More than a month ago                       [facility] Adult PT Treatment/Exercise - 10/31/19 0001      Exercises   Exercises  Knee/Hip      Knee/Hip Exercises: Aerobic   Recumbent Bike  Level 3 x 15 minutes.      Acupuncturist Location  Right knee.    Electrical Stimulation Action  IFC    Electrical Stimulation Parameters  80-150 Hz x 20 minutes.    Electrical Stimulation Goals  Pain      Manual Therapy   Joint Mobilization  In supine:  Right patellar mobs and sustained right hamstring stretch (8 minutes total).                  PT Long Term Goals - 10/19/19 1545      PT LONG TERM GOAL #1   Title  Independent with a HEP.    Time  4    Period  Weeks    Status  New  PT LONG TERM GOAL #2   Title  Perform ADL's with pain not > 3/10.    Time  4    Period  Weeks    Status  New      PT LONG TERM GOAL #3   Title  Restore normal right patellar mobility.    Time  4    Period  Weeks    Status  New            Plan - 10/31/19 1414    Clinical Impression Statement  Patient states her knee feels pretty good today though she had increased pain over the weekend.  She reported doing stairs and perhaps cold weather contributed to the rise in pain-level.    Examination-Activity Limitations  Stairs;Squat;Other    Examination-Participation Restrictions  Other    Stability/Clinical Decision Making  Evolving/Moderate complexity    Clinical Decision Making  Moderate    Rehab Potential  Good    PT Frequency  2x / week    PT Duration  4 weeks    PT Treatment/Interventions  ADLs/Self Care Home Management;Cryotherapy;Electrical Stimulation;Moist Heat;Ultrasound;Neuromuscular re-education;Patient/family education;Manual techniques;Passive range of motion;Taping;Vasopneumatic Device    PT Next Visit Plan  FOTO next visit,  already set up.  Right patellar mobs.  Hamstring strengthening.  Pain-free stationary bike and pain-free OKC/CKC right quad exercises. Can trying taping and VMS to right VMO.  Can try taping.    Consulted and Agree with Plan of Care  Patient       Patient will benefit from skilled therapeutic intervention in order to improve the following deficits and impairments:  Pain, Decreased activity tolerance, Decreased range of motion  Visit Diagnosis: Chronic pain of right knee     Problem List Patient Active Problem List   Diagnosis Date Noted  . Gestational edema, postpartum 08/27/2015  . Postpartum care following cesarean delivery (11/26) 08/25/2015  . Acute epigastric pain 08/24/2015  . Severe preeclampsia 08/24/2015  . Preeclampsia, severe 08/24/2015    Tamari Busic, [place] MPT 10/31/2019, 2:18 PM  [facility] [address] [place], [place], [address] Phone: [phone]   Fax:  [phone]  Name: Connie Logan MRN: [id number] Date of Birth: 1981/03/12

## 2019-11-02 ENCOUNTER — Other Ambulatory Visit: Payer: Self-pay

## 2019-11-02 ENCOUNTER — Ambulatory Visit: Payer: No Typology Code available for payment source | Admitting: *Deleted

## 2019-11-02 DIAGNOSIS — M25561 Pain in right knee: Secondary | ICD-10-CM

## 2019-11-02 DIAGNOSIS — G8929 Other chronic pain: Secondary | ICD-10-CM

## 2019-11-02 NOTE — Therapy (Signed)
[facility] [address] [place], [place], [address] Phone: [phone]   Fax:  [phone]  Physical Therapy Treatment  Patient Details  Name: Connie Logan MRN: [id number] Date of Birth: March 31, 1981 Referring Provider (PT): Reino Bellis DO.   Encounter Date: 11/02/2019  PT End of Session - 11/02/19 1312    Visit Number  5    Number of Visits  8    Date for PT Re-Evaluation  11/16/19    Authorization Type  FOTO    PT Start Time  1303    PT Stop Time  1353    PT Time Calculation (min)  50 min       Past Medical History:  Diagnosis Date  . GERD (gastroesophageal reflux disease)   . Gestational edema, postpartum 08/27/2015  . Headache    migraines  . Kidney stones     Past Surgical History:  Procedure Laterality Date  . CESAREAN SECTION N/A 08/24/2015   Procedure: CESAREAN SECTION;  Surgeon: Olivia Mackie, MD;  Location: [facility];  Service: Obstetrics;  Laterality: N/A;  . ROBOTIC ASSISTED DIAGNOSTIC LAPAROSCOPY N/A 10/11/2014   Procedure: ROBOTIC ASSISTED DIAGNOSTIC LAPAROSCOPY EXCISION/ABLATION OF ENDOMETRIOSIS ;  Surgeon: Lenoard Aden, MD;  Location: [facility];  Service: Gynecology;  Laterality: N/A;  . WISDOM TOOTH EXTRACTION      There were no vitals filed for this visit.  Subjective Assessment - 11/02/19 1311    Subjective  COVID-19 screen performed prior to patient entering clinic. RT knee is hurting today    Limitations  Standing;Walking    How long can you walk comfortably?  Community distances but painful.    Diagnostic tests  X-rays.    Patient Stated Goals  Get out of pain and get quality of life back (ie:  Play with child, exercise).    Currently in Pain?  Yes    Pain Score  5     Pain Location  Knee    Pain Orientation  Right    Pain Descriptors / Indicators  Tender    Pain Type  Chronic pain    Pain Onset  More than a month ago    Pain Frequency  Constant                       [facility]  Adult PT Treatment/Exercise - 11/02/19 0001      Exercises   Exercises  Knee/Hip      Knee/Hip Exercises: Aerobic   Recumbent Bike  Level 3 x 15 minutes.      Knee/Hip Exercises: Machines for Strengthening   Cybex Knee Extension  10# RT LE 90-30 degrees 3x10      Knee/Hip Exercises: Standing   Rocker Board  3 minutes   calf stretching     Modalities   Modalities  Electrical Stimulation;Moist Heat      Electrical Stimulation   Electrical Stimulation Location  Right knee.    Electrical Stimulation Action  IFC    Electrical Stimulation Parameters  80-150hz  x 15 mins    Electrical Stimulation Goals  Pain      Manual Therapy   Manual Therapy  Joint mobilization    Joint Mobilization  In supine:  Right patellar mobs and STW/ IASTM  to distal ITB and surrounding tissues                  PT Long Term Goals - 10/19/19 1545      PT LONG TERM  GOAL #1   Title  Independent with a HEP.    Time  4    Period  Weeks    Status  New      PT LONG TERM GOAL #2   Title  Perform ADL's with pain not > 3/10.    Time  4    Period  Weeks    Status  New      PT LONG TERM GOAL #3   Title  Restore normal right patellar mobility.    Time  4    Period  Weeks    Status  New            Plan - 11/02/19 1313    Clinical Impression Statement  Pt arrived today with Rt knee pain a little worse. She was able to perform some pain free quad strengthening, f/b STW to distal ITB and surrounding tissues.She had notable tenderness in these areas. Normal modality response today    Examination-Activity Limitations  Stairs;Squat;Other    Examination-Participation Restrictions  Other    Stability/Clinical Decision Making  Evolving/Moderate complexity    Rehab Potential  Good    PT Frequency  2x / week    PT Duration  4 weeks    PT Treatment/Interventions  ADLs/Self Care Home Management;Cryotherapy;Electrical Stimulation;Moist Heat;Ultrasound;Neuromuscular re-education;Patient/family  education;Manual techniques;Passive range of motion;Taping;Vasopneumatic Device    PT Next Visit Plan  FOTO next visit, already set up.  Right patellar mobs.  Hamstring strengthening.  Pain-free stationary bike and pain-free OKC/CKC right quad exercises. Can trying taping and VMS to right VMO.  Can try taping.       Patient will benefit from skilled therapeutic intervention in order to improve the following deficits and impairments:  Pain, Decreased activity tolerance, Decreased range of motion  Visit Diagnosis: Chronic pain of right knee     Problem List Patient Active Problem List   Diagnosis Date Noted  . Gestational edema, postpartum 08/27/2015  . Postpartum care following cesarean delivery (11/26) 08/25/2015  . Acute epigastric pain 08/24/2015  . Severe preeclampsia 08/24/2015  . Preeclampsia, severe 08/24/2015    RAMSEUR,CHRIS, PTA 11/02/2019, 5:42 PM  [facility] [address] [place], [place], [address] Phone: [phone]   Fax:  [phone]  Name: Connie Logan MRN: [id number] Date of Birth: August 23, 1981

## 2019-11-06 ENCOUNTER — Encounter: Payer: Self-pay | Admitting: Physical Therapy

## 2019-11-06 ENCOUNTER — Other Ambulatory Visit: Payer: Self-pay

## 2019-11-06 ENCOUNTER — Ambulatory Visit: Payer: No Typology Code available for payment source | Admitting: Physical Therapy

## 2019-11-06 DIAGNOSIS — M25561 Pain in right knee: Secondary | ICD-10-CM | POA: Diagnosis not present

## 2019-11-06 DIAGNOSIS — G8929 Other chronic pain: Secondary | ICD-10-CM

## 2019-11-06 NOTE — Therapy (Signed)
[facility] [place], [place], [address] Phone: [phone]   Fax:  [phone]  Physical Therapy Treatment  Patient Details  Name: Connie Logan MRN: [id number] Date of Birth: [birth year] Referring Provider (PT): Lilia Argue DO.   Encounter Date: 11/06/2019  PT End of Session - 11/06/19 1338    Visit Number  6    Number of Visits  8    Date for PT Re-Evaluation  11/16/19    Authorization Type  FOTO    PT Start Time  1303    PT Stop Time  1354    PT Time Calculation (min)  51 min    Activity Tolerance  Patient tolerated treatment well    Behavior During Therapy  WFL for tasks assessed/performed       Past Medical History:  Diagnosis Date  . GERD (gastroesophageal reflux disease)   . Gestational edema, postpartum 08/27/2015  . Headache    migraines  . Kidney stones     Past Surgical History:  Procedure Laterality Date  . CESAREAN SECTION N/A 08/24/2015   Procedure: CESAREAN SECTION;  Surgeon: Brien Few, MD;  Location: [facility];  Service: Obstetrics;  Laterality: N/A;  . ROBOTIC ASSISTED DIAGNOSTIC LAPAROSCOPY N/A 10/11/2014   Procedure: ROBOTIC ASSISTED DIAGNOSTIC LAPAROSCOPY EXCISION/ABLATION OF ENDOMETRIOSIS ;  Surgeon: Lovenia Kim, MD;  Location: [facility];  Service: Gynecology;  Laterality: N/A;  . WISDOM TOOTH EXTRACTION      There were no vitals filed for this visit.  Subjective Assessment - 11/06/19 1308    Subjective  COVID-19 screen performed prior to patient entering clinic. Had increased pain for 2 days after previous treatment and was very tender to touch. Patient states that distal ITB is still very tender to touch.    Limitations  Standing;Walking    How long can you walk comfortably?  Community distances but painful.    Diagnostic tests  X-rays.    Patient Stated Goals  Get out of pain and get quality of life back (ie:  Play with child, exercise).    Currently in Pain?  Yes    Pain Score   3     Pain Location  Knee    Pain Orientation  Right;Lateral    Pain Descriptors / Indicators  Tender;Sore;Discomfort    Pain Type  Chronic pain    Pain Onset  More than a month ago    Pain Frequency  Constant         [facility] PT Assessment - 11/06/19 0001      Assessment   Medical Diagnosis  Chondromalacia of both patellae.    Referring Provider (PT)  Lilia Argue DO.    Next MD Visit  11/21/2019      Restrictions   Weight Bearing Restrictions  No                   [facility] Adult PT Treatment/Exercise - 11/06/19 0001      Knee/Hip Exercises: Aerobic   Recumbent Bike  L3 x16 min      Knee/Hip Exercises: Machines for Strengthening   Cybex Knee Extension  10# 3x10 reps    Cybex Knee Flexion  30# 3x10 reps      Knee/Hip Exercises: Standing   Rocker Board  3 minutes      Knee/Hip Exercises: Supine   Straight Leg Raises  AROM;Right;3 sets;10 reps      Modalities   Modalities  Electrical Stimulation;Vasopneumatic  Biomedical engineer  IFC    Electrical Stimulation Parameters  80-150 hz x15 min    Electrical Stimulation Goals  Pain;Edema      Vasopneumatic   Number Minutes Vasopneumatic   10 minutes    Vasopnuematic Location   Knee    Vasopneumatic Pressure  Medium    Vasopneumatic Temperature   34                  PT Long Term Goals - 10/19/19 1545      PT LONG TERM GOAL #1   Title  Independent with a HEP.    Time  4    Period  Weeks    Status  New      PT LONG TERM GOAL #2   Title  Perform ADL's with pain not > 3/10.    Time  4    Period  Weeks    Status  New      PT LONG TERM GOAL #3   Title  Restore normal right patellar mobility.    Time  4    Period  Weeks    Status  New            Plan - 11/06/19 1342    Clinical Impression Statement  Patient presented in clinic with reports of increased pain and sensitivity since IASTW last week.  Patient reports very painful to touch along distal R ITB. More painfree strengthening focus completed today with no reports of any increased pain. Normal modalities response noted following removal of the modalities.    Examination-Activity Limitations  Stairs;Squat;Other    Examination-Participation Restrictions  Other    Stability/Clinical Decision Making  Evolving/Moderate complexity    Rehab Potential  Good    PT Frequency  2x / week    PT Duration  4 weeks    PT Treatment/Interventions  ADLs/Self Care Home Management;Cryotherapy;Electrical Stimulation;Moist Heat;Ultrasound;Neuromuscular re-education;Patient/family education;Manual techniques;Passive range of motion;Taping;Vasopneumatic Device    PT Next Visit Plan  FOTO next visit, already set up.  Right patellar mobs.  Hamstring strengthening.  Pain-free stationary bike and pain-free OKC/CKC right quad exercises. Can trying taping and VMS to right VMO.  Can try taping.    Consulted and Agree with Plan of Care  Patient       Patient will benefit from skilled therapeutic intervention in order to improve the following deficits and impairments:  Pain, Decreased activity tolerance, Decreased range of motion  Visit Diagnosis: Chronic pain of right knee     Problem List Patient Active Problem List   Diagnosis Date Noted  . Gestational edema, postpartum 08/27/2015  . Postpartum care following cesarean delivery (11/26) 08/25/2015  . Acute epigastric pain 08/24/2015  . Severe preeclampsia 08/24/2015  . Preeclampsia, severe 08/24/2015    Marvell Fuller, PTA 11/06/2019, 2:31 PM  [facility] [address] [place], [place], [address] Phone: [phone]   Fax:  [phone]  Name: Connie Logan MRN: [id number] Date of Birth: [birth year]

## 2019-11-08 ENCOUNTER — Other Ambulatory Visit: Payer: Self-pay

## 2019-11-08 ENCOUNTER — Ambulatory Visit: Payer: No Typology Code available for payment source | Admitting: Physical Therapy

## 2019-11-08 DIAGNOSIS — M25561 Pain in right knee: Secondary | ICD-10-CM | POA: Diagnosis not present

## 2019-11-08 DIAGNOSIS — G8929 Other chronic pain: Secondary | ICD-10-CM

## 2019-11-08 NOTE — Therapy (Signed)
[facility] [address] [place], [place], [address] Phone: [phone]   Fax:  [phone]  Physical Therapy Treatment  Patient Details  Name: Connie Logan MRN: [id number] Date of Birth: May 18, 1981 Referring Provider (PT): Reino Bellis DO.   Encounter Date: 11/08/2019  PT End of Session - 11/08/19 1417    Visit Number  7    Number of Visits  8    Date for PT Re-Evaluation  11/16/19    Authorization Type  FOTO    PT Start Time  0105    PT Stop Time  0200    PT Time Calculation (min)  55 min    Activity Tolerance  Patient tolerated treatment well    Behavior During Therapy  WFL for tasks assessed/performed       Past Medical History:  Diagnosis Date  . GERD (gastroesophageal reflux disease)   . Gestational edema, postpartum 08/27/2015  . Headache    migraines  . Kidney stones     Past Surgical History:  Procedure Laterality Date  . CESAREAN SECTION N/A 08/24/2015   Procedure: CESAREAN SECTION;  Surgeon: Olivia Mackie, MD;  Location: [facility];  Service: Obstetrics;  Laterality: N/A;  . ROBOTIC ASSISTED DIAGNOSTIC LAPAROSCOPY N/A 10/11/2014   Procedure: ROBOTIC ASSISTED DIAGNOSTIC LAPAROSCOPY EXCISION/ABLATION OF ENDOMETRIOSIS ;  Surgeon: Lenoard Aden, MD;  Location: [facility];  Service: Gynecology;  Laterality: N/A;  . WISDOM TOOTH EXTRACTION      There were no vitals filed for this visit.  Subjective Assessment - 11/08/19 1339    Subjective  COVID-19 screen performed prior to patient entering clinic.  Some days I have no pain.  Good/bad days.    Limitations  Standing;Walking    How long can you walk comfortably?  Community distances but painful.    Diagnostic tests  X-rays.    Patient Stated Goals  Get out of pain and get quality of life back (ie:  Play with child, exercise).    Currently in Pain?  Other (Comment)    Pain Score  5     Pain Location  Knee    Pain Orientation  Right    Pain Onset  More than a month  ago                       [facility] Adult PT Treatment/Exercise - 11/08/19 0001      Exercises   Exercises  Knee/Hip      Knee/Hip Exercises: Aerobic   Recumbent Bike  Level 3 x 10 minutes.      Modalities   Modalities  Electrical Stimulation;Ultrasound      Insurance claims handler Stimulation Location  Right knee.    Electrical Stimulation Action  Pre-mod.    Electrical Stimulation Parameters  80-150 Hz x 20 minutes.    Electrical Stimulation Goals  Edema;Pain      Ultrasound   Ultrasound Location  Righ lateral knee.    Ultrasound Parameters  Combo e'stim/U/S at 1.50 W/CM2 x 10 minutes.    Ultrasound Goals  Pain      Vasopneumatic   Number Minutes Vasopneumatic   20 minutes    Vasopnuematic Location   --   Right knee.   Vasopneumatic Pressure  Medium                  PT Long Term Goals - 10/19/19 1545      PT LONG TERM GOAL #1   Title  Independent with a HEP.    Time  4    Period  Weeks    Status  New      PT LONG TERM GOAL #2   Title  Perform ADL's with pain not > 3/10.    Time  4    Period  Weeks    Status  New      PT LONG TERM GOAL #3   Title  Restore normal right patellar mobility.    Time  4    Period  Weeks    Status  New            Plan - 11/08/19 1353    Clinical Impression Statement  The patient did well today with treatnment.  Performed combo e'stim/U/S today without complaint.She feels her pain rise today could be weather related.    Examination-Activity Limitations  Stairs;Squat;Other    Examination-Participation Restrictions  Other    Stability/Clinical Decision Making  Evolving/Moderate complexity    Rehab Potential  Good    PT Frequency  2x / week    PT Duration  4 weeks    PT Treatment/Interventions  ADLs/Self Care Home Management;Cryotherapy;Electrical Stimulation;Moist Heat;Ultrasound;Neuromuscular re-education;Patient/family education;Manual techniques;Passive range of  motion;Taping;Vasopneumatic Device    PT Next Visit Plan  FOTO next visit, already set up.  Right patellar mobs.  Hamstring strengthening.  Pain-free stationary bike and pain-free OKC/CKC right quad exercises. Can trying taping and VMS to right VMO.  Can try taping.    Consulted and Agree with Plan of Care  Patient       Patient will benefit from skilled therapeutic intervention in order to improve the following deficits and impairments:  Pain, Decreased activity tolerance, Decreased range of motion  Visit Diagnosis: Chronic pain of right knee     Problem List Patient Active Problem List   Diagnosis Date Noted  . Gestational edema, postpartum 08/27/2015  . Postpartum care following cesarean delivery (11/26) 08/25/2015  . Acute epigastric pain 08/24/2015  . Severe preeclampsia 08/24/2015  . Preeclampsia, severe 08/24/2015    Raysa Bosak, [place] MPT 11/08/2019, 2:18 PM  [facility] [address] [place], [place], [address] Phone: [phone]   Fax:  [phone]  Name: CECIA EGGE MRN: [id number] Date of Birth: 1981-02-19

## 2019-11-14 ENCOUNTER — Ambulatory Visit: Payer: No Typology Code available for payment source | Admitting: *Deleted

## 2019-11-14 ENCOUNTER — Other Ambulatory Visit: Payer: Self-pay

## 2019-11-14 DIAGNOSIS — G8929 Other chronic pain: Secondary | ICD-10-CM

## 2019-11-14 DIAGNOSIS — M25561 Pain in right knee: Secondary | ICD-10-CM | POA: Diagnosis not present

## 2019-11-14 NOTE — Therapy (Addendum)
[facility] [place], [place], [address] Phone: [phone]   Fax:  [phone]  Physical Therapy Treatment PHYSICAL THERAPY DISCHARGE SUMMARY  Visits from Start of Care: 8  Current functional level related to goals / functional outcomes: See below   Remaining deficits: See goals   Education / Equipment: HEP Plan: Patient agrees to discharge.  Patient goals were partially met. Patient is being discharged due to being pleased with the current functional level.  ?????     Patient Details  Name: Connie Logan MRN: [id number] Date of Birth: 01/14/1981 Referring Provider (PT): Lilia Argue DO.   Encounter Date: 11/14/2019  PT End of Session - 11/14/19 1345    Visit Number  8    Number of Visits  8    Date for PT Re-Evaluation  11/16/19    Authorization Type  FOTO    PT Start Time  1303    PT Stop Time  1353    PT Time Calculation (min)  50 min       Past Medical History:  Diagnosis Date  . GERD (gastroesophageal reflux disease)   . Gestational edema, postpartum 08/27/2015  . Headache    migraines  . Kidney stones     Past Surgical History:  Procedure Laterality Date  . CESAREAN SECTION N/A 08/24/2015   Procedure: CESAREAN SECTION;  Surgeon: Brien Few, MD;  Location: [facility];  Service: Obstetrics;  Laterality: N/A;  . ROBOTIC ASSISTED DIAGNOSTIC LAPAROSCOPY N/A 10/11/2014   Procedure: ROBOTIC ASSISTED DIAGNOSTIC LAPAROSCOPY EXCISION/ABLATION OF ENDOMETRIOSIS ;  Surgeon: Lovenia Kim, MD;  Location: [facility];  Service: Gynecology;  Laterality: N/A;  . WISDOM TOOTH EXTRACTION      There were no vitals filed for this visit.  Subjective Assessment - 11/14/19 1306    Subjective  COVID-19 screen performed prior to patient entering clinic.  Some days I have no pain.  Good/bad days. Did good after last Rx. 2/10 RT knee ache today 30-40% better.    Limitations  Standing;Walking    How long can you walk  comfortably?  Community distances but painful.    Diagnostic tests  X-rays.    Patient Stated Goals  Get out of pain and get quality of life back (ie:  Play with child, exercise).    Currently in Pain?  Yes    Pain Location  Knee    Pain Orientation  Right    Pain Descriptors / Indicators  Aching    Pain Onset  More than a month ago                       [facility] Adult PT Treatment/Exercise - 11/14/19 0001      Exercises   Exercises  Knee/Hip      Knee/Hip Exercises: Aerobic   Recumbent Bike  Level 3 x 10 minutes.      Modalities   Modalities  Electrical Stimulation;Ultrasound      Acupuncturist Stimulation Location  Right knee.    Child psychotherapist Parameters  80-150hz  x 15 mins    Electrical Stimulation Goals  Edema;Pain      Ultrasound   Ultrasound Location  Rt knee lateral aspect    Ultrasound Parameters  combo x 10 mins 1.5 w/cm2     Ultrasound Goals  Pain      Vasopneumatic   Number Minutes Vasopneumatic   15 minutes  Vasopnuematic Location   --   Right knee.   Vasopneumatic Pressure  Medium    Vasopneumatic Temperature   34                  PT Long Term Goals - 11/14/19 1404      PT LONG TERM GOAL #1   Title  Independent with a HEP.    Time  4    Period  Weeks    Status  Partially Met      PT LONG TERM GOAL #2   Title  Perform ADL's with pain not > 3/10.    Time  4    Period  Weeks    Status  Partially Met      PT LONG TERM GOAL #3   Title  Restore normal right patellar mobility.    Time  4    Period  Weeks    Status  Partially Met            Plan - 11/14/19 1347    Clinical Impression Statement  Pt arrived today doing better with decreased pain RT knee and 2/10 ache lateral aspect. Combo US/estim performed again to RT knee lateral aspect with good results. Normal modality response today. To MD next Tuesday. LTGs are partially met. She feels 30-40%  better..    Examination-Activity Limitations  Stairs;Squat;Other    Stability/Clinical Decision Making  Evolving/Moderate complexity    Rehab Potential  Good    PT Frequency  2x / week    PT Duration  4 weeks    PT Treatment/Interventions  ADLs/Self Care Home Management;Cryotherapy;Electrical Stimulation;Moist Heat;Ultrasound;Neuromuscular re-education;Patient/family education;Manual techniques;Passive range of motion;Taping;Vasopneumatic Device    Consulted and Agree with Plan of Care  Patient       Patient will benefit from skilled therapeutic intervention in order to improve the following deficits and impairments:  Pain, Decreased activity tolerance, Decreased range of motion  Visit Diagnosis: Chronic pain of right knee     Problem List Patient Active Problem List   Diagnosis Date Noted  . Gestational edema, postpartum 08/27/2015  . Postpartum care following cesarean delivery (11/26) 08/25/2015  . Acute epigastric pain 08/24/2015  . Severe preeclampsia 08/24/2015  . Preeclampsia, severe 08/24/2015    Bridey Brookover,CHRIS , PTA 11/14/2019, 4:54 PM  [facility] [address] [place], [place], [address] Phone: [phone]   Fax:  [phone]  Name: KATIYA FIKE MRN: [id number] Date of Birth: 1980-10-11

## 2019-11-15 ENCOUNTER — Encounter: Payer: Self-pay | Admitting: *Deleted

## 2019-11-16 ENCOUNTER — Encounter: Payer: PRIVATE HEALTH INSURANCE | Admitting: *Deleted

## 2019-11-21 ENCOUNTER — Other Ambulatory Visit: Payer: Self-pay

## 2019-11-21 ENCOUNTER — Ambulatory Visit (INDEPENDENT_AMBULATORY_CARE_PROVIDER_SITE_OTHER): Payer: PRIVATE HEALTH INSURANCE | Admitting: Sports Medicine

## 2019-11-21 VITALS — BP 124/82 | Ht 63.0 in | Wt 170.0 lb

## 2019-11-21 DIAGNOSIS — M2241 Chondromalacia patellae, right knee: Secondary | ICD-10-CM

## 2019-11-21 DIAGNOSIS — M2242 Chondromalacia patellae, left knee: Secondary | ICD-10-CM | POA: Diagnosis not present

## 2019-11-22 NOTE — Progress Notes (Signed)
   Subjective:    Patient ID: Connie Logan, female    DOB: 10-19-1980, 38 y.o.   MRN: [id number]  HPI   Patient comes in today for follow-up on bilateral knee pain, right greater than left.  She has noticed some improvement with physical therapy.  She does note that she has days where she feels pretty good but she also has days where her pain is pretty severe.  She still localizes the pain to the retropatellar area.  He has had a total of 8 physical therapy visits.  Unfortunately, they did not do a trial of McConnell taping as requested.    Review of Systems    As above Objective:   Physical Exam  Well-developed, well-nourished.  No acute distress.  Awake alert and oriented x3.  Vital signs reviewed  Examination of both knees shows full range of motion.  No obvious effusion.  Once again appreciated is tethering of both patella laterally.  Negative McMurray's.  Knees remain stable ligamentous exam.  Neurovascular intact distally.      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  Phone follow-up with those results when available.  We will delineate further treatment based on those findings.

## 2019-12-07 ENCOUNTER — Other Ambulatory Visit: Payer: PRIVATE HEALTH INSURANCE

## 2019-12-16 ENCOUNTER — Ambulatory Visit
Admission: RE | Admit: 2019-12-16 | Discharge: 2019-12-16 | Disposition: A | Discharge status: Home / Self Care | Payer: PRIVATE HEALTH INSURANCE | Source: Ambulatory Visit | Attending: Sports Medicine | Admitting: Sports Medicine

## 2019-12-16 ENCOUNTER — Other Ambulatory Visit: Payer: Self-pay

## 2019-12-16 DIAGNOSIS — M2242 Chondromalacia patellae, left knee: Secondary | ICD-10-CM

## 2019-12-16 DIAGNOSIS — M2241 Chondromalacia patellae, right knee: Secondary | ICD-10-CM

## 2019-12-21 ENCOUNTER — Telehealth: Payer: Self-pay | Admitting: Sports Medicine

## 2019-12-21 NOTE — Telephone Encounter (Signed)
  I spoke with Connie Logan on the phone today about the MRI of her right knee done a few days ago.  MRI shows some mild chondral thinning in the patellofemoral joint.  Otherwise it is unremarkable.  Her chondromalacia is certainly not severe enough that I think she needs to consider lateral release.  Patient is reassured that I do not see any obvious operative pathology on the MRI.  She reports that the knee is actually feeling better.  I would recommend no further work-up or treatment at this time but if her pain returns we could consider a single cortisone injection.  Patient will follow up with me as needed.

## 2020-02-02 ENCOUNTER — Encounter: Payer: Self-pay | Admitting: Nurse Practitioner

## 2020-02-02 ENCOUNTER — Ambulatory Visit (INDEPENDENT_AMBULATORY_CARE_PROVIDER_SITE_OTHER): Payer: PRIVATE HEALTH INSURANCE | Admitting: Nurse Practitioner

## 2020-02-02 VITALS — BP 122/76 | HR 80 | Temp 98.4°F | Ht 63.0 in | Wt 170.0 lb

## 2020-02-02 DIAGNOSIS — N3000 Acute cystitis without hematuria: Secondary | ICD-10-CM

## 2020-02-02 MED ORDER — NITROFURANTOIN MONOHYD MACRO 100 MG PO CAPS: 100.0000 mg | ORAL_CAPSULE | Freq: Two times a day (BID) | ORAL | 0 refills | Status: DC

## 2020-02-02 NOTE — Progress Notes (Signed)
   Subjective:    Patient ID: Connie Logan, female    DOB: 01/27/1981, 40 y.o.   MRN: [id number]   Chief Complaint: Dysuria   HPI Patient come in today c/o dysuria. Started several day ago and low back pain tatrted thi morning. He ha noticed that she has been voiding more frequently today. Doe not fill like he completely empties her bladder.   Review of Systems  Genitourinary: Positive for dysuria, frequency and urgency. Negative for decreased urine volume, menstrual problem, pelvic pain, vaginal bleeding, vaginal discharge and vaginal pain.  All other systems reviewed and are negative.      Objective:   Physical Exam Vitals and nursing note reviewed.  Constitutional:      Appearance: Normal appearance.  Cardiovascular:     Rate and Rhythm: Normal rate and regular rhythm.     Heart sounds: Normal heart sounds.  Pulmonary:     Breath sounds: Normal breath sounds.  Abdominal:     General: Abdomen is flat.     Tenderness: There is no abdominal tenderness. There is no right CVA tenderness or left CVA tenderness.  Skin:    General: Skin is warm.  Neurological:     Mental Status: She is alert.  Psychiatric:        Behavior: Behavior normal.   Blood pressure 122/76, pulse 80, temperature 98.4 F (36.9 C), temperature source Oral, height 5\' 3"  (1.6 m), weight 170 lb (77.1 kg), currently breastfeeding.             Assessment & Plan:  Connie Logan in today with chief complaint of Dysuria   1. Acute cystitis without hematuria Take medication as prescribe Cotton underwear Take shower not bath Cranberry juice, yogurt Force fluids AZO over the counter X2 days Culture pending RTO prn  - Urine Culture  Meds ordered this encounter  Medications  . nitrofurantoin, macrocrystal-monohydrate, (MACROBID) 100 MG capsule    Sig: Take 1 capsule (100 mg total) by mouth 2 (two) times daily. 1 po BId    Dispense:  14 capsule    Refill:  0    Order Specific Question:    Supervising Provider    Answer:   Connie Logan A [[id number]]     The above assessment and management plan was discussed with the patient. The patient verbalized understanding of and has agreed to the management plan. Patient is aware to call the clinic if symptoms persist or worsen. Patient is aware when to return to the clinic for a follow-up visit. Patient educated on when it is appropriate to go to the emergency department.   Connie Arville Care, FNP

## 2020-02-04 LAB — URINE CULTURE

## 2020-03-08 ENCOUNTER — Other Ambulatory Visit: Payer: Self-pay | Admitting: Family Medicine

## 2020-03-08 DIAGNOSIS — K219 Gastro-esophageal reflux disease without esophagitis: Secondary | ICD-10-CM

## 2020-03-08 NOTE — Telephone Encounter (Signed)
OV 08/18/19 RTC 1 yr

## 2020-04-12 ENCOUNTER — Ambulatory Visit (INDEPENDENT_AMBULATORY_CARE_PROVIDER_SITE_OTHER): Payer: PRIVATE HEALTH INSURANCE | Admitting: Nurse Practitioner

## 2020-04-12 ENCOUNTER — Encounter: Payer: Self-pay | Admitting: Nurse Practitioner

## 2020-04-12 VITALS — BP 124/83 | HR 89 | Temp 97.2°F | Ht 63.0 in | Wt 160.0 lb

## 2020-04-12 DIAGNOSIS — J0101 Acute recurrent maxillary sinusitis: Secondary | ICD-10-CM | POA: Insufficient documentation

## 2020-04-12 MED ORDER — AMOXICILLIN-POT CLAVULANATE 875-125 MG PO TABS: 1.0000 | ORAL_TABLET | Freq: Two times a day (BID) | ORAL | 0 refills | Status: DC

## 2020-04-12 MED ORDER — SALINE SPRAY 0.65 % NA SOLN: 1.0000 | NASAL | 1 refills | Status: DC | PRN

## 2020-04-12 MED ORDER — PREDNISONE 10 MG (21) PO TBPK: ORAL_TABLET | ORAL | 0 refills | Status: DC

## 2020-04-12 MED ORDER — ACETAMINOPHEN 500 MG PO TABS: 500.0000 mg | ORAL_TABLET | Freq: Four times a day (QID) | ORAL | 0 refills | Status: AC | PRN

## 2020-04-12 NOTE — Assessment & Plan Note (Signed)
Patient is a 39 year old female who presents to clinic for sinusitis.  This is new for patient but has been ongoing in the last 5-7 days.  Patient is experiencing facial pain and pressure.  Patient rates pain a 5 out of 10 on the pain scale of 0-10.  Patient reporting headache, cough with green sputum.  Patient reports ear pain.  Patient denies nausea, vomiting and fever. Provided education to patient on sinusitis.  Printed handout given. Started patient on Augmentin, prednisone pack, Tylenol for headache and nasal saline spray.  Advised patient to rest and increase hydration. Patient knows to follow-up with worsening or unresolved symptoms. Rx sent to pharmacy

## 2020-04-12 NOTE — Progress Notes (Signed)
Acute Office Visit  Subjective:    Patient ID: Connie Logan, female    DOB: 1981/03/08, 39 y.o.   MRN: [id number]  Chief Complaint  Patient presents with  . Nasal Congestion  . Sinus Pressure    Sinusitis This is a recurrent problem. The current episode started in the past 7 days. The problem is unchanged. There has been no fever. Her pain is at a severity of 5/10. The pain is moderate. Associated symptoms include coughing, ear pain, headaches and sinus pressure. Pertinent negatives include no sore throat. Past treatments include nothing.     Past Medical History:  Diagnosis Date  . GERD (gastroesophageal reflux disease)   . Gestational edema, postpartum 08/27/2015  . Headache    migraines  . Kidney stones     Past Surgical History:  Procedure Laterality Date  . CESAREAN SECTION N/A 08/24/2015   Procedure: CESAREAN SECTION;  Surgeon: Olivia Mackie, MD;  Location: [facility];  Service: Obstetrics;  Laterality: N/A;  . ROBOTIC ASSISTED DIAGNOSTIC LAPAROSCOPY N/A 10/11/2014   Procedure: ROBOTIC ASSISTED DIAGNOSTIC LAPAROSCOPY EXCISION/ABLATION OF ENDOMETRIOSIS ;  Surgeon: Lenoard Aden, MD;  Location: [facility];  Service: Gynecology;  Laterality: N/A;  . WISDOM TOOTH EXTRACTION      Family History  Problem Relation Age of Onset  . Hypertension Mother   . Cancer Mother   . Cancer Maternal Grandmother   . Diabetes Father     Social History   Socioeconomic History  . Marital status: Married    Spouse name: Not on file  . Number of children: Not on file  . Years of education: Not on file  . Highest education level: Not on file  Occupational History  . Not on file  Tobacco Use  . Smoking status: Never Smoker  . Smokeless tobacco: Never Used  Vaping Use  . Vaping Use: Never used  Substance and Sexual Activity  . Alcohol use: Yes    Comment: occ  . Drug use: No  . Sexual activity: Yes    Birth control/protection: Pill  Other Topics Concern  . Not on file  Social  History Narrative  . Not on file   Social Determinants of Health   Financial Resource Strain:   . Difficulty of Paying Living Expenses:   Food Insecurity:   . Worried About Programme researcher, broadcasting/film/video in the Last Year:   . Barista in the Last Year:   Transportation Needs:   . Freight forwarder (Medical):   Marland Kitchen Lack of Transportation (Non-Medical):   Physical Activity:   . Days of Exercise per Week:   . Minutes of Exercise per Session:   Stress:   . Feeling of Stress :   Social Connections:   . Frequency of Communication with Friends and Family:   . Frequency of Social Gatherings with Friends and Family:   . Attends Religious Services:   . Active Member of Clubs or Organizations:   . Attends Banker Meetings:   Marland Kitchen Marital Status:   Intimate Partner Violence:   . Fear of Current or Ex-Partner:   . Emotionally Abused:   Marland Kitchen Physically Abused:   . Sexually Abused:     Outpatient Medications Prior to Visit  Medication Sig Dispense Refill  . Levonorgestrel-Ethinyl Estradiol (AMETHIA,CAMRESE) 0.15-0.03 &0.01 MG tablet Take 1 tablet by mouth daily.  4  . naproxen (NAPROSYN) 500 MG tablet Take 1 tablet (500 mg total) by mouth 2 (two) times daily as  needed. 40 tablet 2  . nitrofurantoin, macrocrystal-monohydrate, (MACROBID) 100 MG capsule Take 1 capsule (100 mg total) by mouth 2 (two) times daily. 1 po BId 14 capsule 0  . pantoprazole (PROTONIX) 40 MG tablet TAKE 1 TABLET BY MOUTH DAILY X2 WEEKS. THEN TRIAL OFF. MAY RESUME IF SYMPTOMS RETURN. 90 tablet 1   No facility-administered medications prior to visit.    Allergies  Allergen Reactions  . Septra [Sulfamethoxazole-Trimethoprim] Rash    Review of Systems  Constitutional: Negative.   HENT: Positive for ear pain, sinus pressure and sinus pain. Negative for dental problem, ear discharge, postnasal drip, sore throat and trouble swallowing.   Eyes: Negative.   Respiratory: Positive for cough.   Gastrointestinal:  Negative for diarrhea and nausea.  Genitourinary: Negative.   Musculoskeletal: Negative.   Skin: Negative for color change and rash.  Neurological: Positive for headaches.  Psychiatric/Behavioral: Negative.        Objective:    Physical Exam Vitals reviewed.  Constitutional:      Appearance: Normal appearance.  HENT:     Head:     Comments: Bilateral ear pain    Nose: Nose normal.     Mouth/Throat:     Mouth: Mucous membranes are moist.  Eyes:     Conjunctiva/sclera: Conjunctivae normal.  Cardiovascular:     Rate and Rhythm: Normal rate and regular rhythm.     Pulses: Normal pulses.     Heart sounds: Normal heart sounds.  Pulmonary:     Effort: Pulmonary effort is normal.     Breath sounds: Normal breath sounds.     Comments: Cough Abdominal:     General: Bowel sounds are normal.  Musculoskeletal:     Cervical back: Neck supple.  Skin:    General: Skin is warm.     Findings: No erythema or rash.  Neurological:     Mental Status: She is alert and oriented to person, place, and time.  Psychiatric:        Mood and Affect: Mood normal.     BP 124/83   Pulse 89   Temp (!) 97.2 F (36.2 C) (Temporal)   Ht 5\' 3"  (1.6 m)   Wt 160 lb (72.6 kg)   BMI 28.34 kg/m  Wt Readings from Last 3 Encounters:  04/12/20 160 lb (72.6 kg)  02/02/20 170 lb (77.1 kg)  11/21/19 170 lb (77.1 kg)    Health Maintenance Due  Topic Date Due  . Hepatitis C Screening  Never done  . COVID-19 Vaccine (1) Never done  . PAP SMEAR-Modifier  10/08/2018      Lab Results  Component Value Date   TSH 1.720 08/18/2019   Lab Results  Component Value Date   WBC 7.7 08/18/2019   HGB 14.1 08/18/2019   HCT 41.8 08/18/2019   MCV 89 08/18/2019   PLT 359 08/18/2019   Lab Results  Component Value Date   NA 138 08/18/2019   K 4.4 08/18/2019   CO2 20 08/18/2019   GLUCOSE 80 08/18/2019   BUN 10 08/18/2019   CREATININE 0.82 08/18/2019   BILITOT 0.3 08/18/2019   ALKPHOS 71 08/18/2019    AST 20 08/18/2019   ALT 20 08/18/2019   PROT 7.4 08/18/2019   ALBUMIN 4.4 08/18/2019   CALCIUM 9.0 08/18/2019   ANIONGAP 6 08/26/2015   Lab Results  Component Value Date   CHOL 209 (H) 08/18/2019   Lab Results  Component Value Date   HDL 62 08/18/2019   Lab  Results  Component Value Date   LDLCALC 120 (H) 08/18/2019   Lab Results  Component Value Date   TRIG 156 (H) 08/18/2019   Lab Results  Component Value Date   CHOLHDL 3.4 08/18/2019   No results found for: HGBA1C     Assessment & Plan:  Acute recurrent maxillary sinusitis Patient is a 39 year old female who presents to clinic for sinusitis.  This is new for patient but has been ongoing in the last 5-7 days.  Patient is experiencing facial pain and pressure.  Patient rates pain a 5 out of 10 on the pain scale of 0-10.  Patient reporting headache, cough with green sputum.  Patient reports ear pain.  Patient denies nausea, vomiting and fever. Provided education to patient on sinusitis.  Printed handout given. Started patient on Augmentin, prednisone pack, Tylenol for headache and nasal saline spray.  Advised patient to rest and increase hydration. Patient knows to follow-up with worsening or unresolved symptoms. Rx sent to pharmacy  Problem List Items Addressed This Visit      Respiratory   Acute recurrent maxillary sinusitis - Primary   Relevant Medications   amoxicillin-clavulanate (AUGMENTIN) 875-125 MG tablet   acetaminophen (TYLENOL) 500 MG tablet   predniSONE (STERAPRED UNI-PAK 21 TAB) 10 MG (21) TBPK tablet   sodium chloride (OCEAN) 0.65 % SOLN nasal spray       Meds ordered this encounter  Medications  . amoxicillin-clavulanate (AUGMENTIN) 875-125 MG tablet    Sig: Take 1 tablet by mouth 2 (two) times daily.    Dispense:  20 tablet    Refill:  0    Order Specific Question:   Supervising Provider    Answer:   Arville Care A [id number]  . acetaminophen (TYLENOL) 500 MG tablet    Sig: Take 1 tablet  (500 mg total) by mouth every 6 (six) hours as needed.    Dispense:  30 tablet    Refill:  0    Order Specific Question:   Supervising Provider    Answer:   Arville Care A [id number]  . predniSONE (STERAPRED UNI-PAK 21 TAB) 10 MG (21) TBPK tablet    Sig: 6 tablet day 1, 5 tablet day 2, 4 tablet day 3, 3 tablet day 4, 2 tablet day 5, 1 tablet day 6    Dispense:  1 each    Refill:  0    Order Specific Question:   Supervising Provider    Answer:   Arville Care A [id number]  . sodium chloride (OCEAN) 0.65 % SOLN nasal spray    Sig: Place 1 spray into both nostrils as needed for congestion.    Dispense:  60 mL    Refill:  1    Order Specific Question:   Supervising Provider    Answer:   Arville Care A [[id number]]     Daryll Drown, NP

## 2020-04-12 NOTE — Patient Instructions (Signed)

## 2020-04-22 ENCOUNTER — Encounter: Payer: Self-pay | Admitting: *Deleted

## 2020-09-04 ENCOUNTER — Ambulatory Visit (INDEPENDENT_AMBULATORY_CARE_PROVIDER_SITE_OTHER): Payer: PRIVATE HEALTH INSURANCE | Admitting: Family Medicine

## 2020-09-04 ENCOUNTER — Other Ambulatory Visit: Payer: Self-pay

## 2020-09-04 ENCOUNTER — Other Ambulatory Visit: Payer: Self-pay | Admitting: Family Medicine

## 2020-09-04 ENCOUNTER — Encounter: Payer: Self-pay | Admitting: Family Medicine

## 2020-09-04 VITALS — BP 137/89 | HR 68 | Temp 97.8°F | Ht 63.0 in | Wt 173.4 lb

## 2020-09-04 DIAGNOSIS — Z789 Other specified health status: Secondary | ICD-10-CM

## 2020-09-04 DIAGNOSIS — E669 Obesity, unspecified: Secondary | ICD-10-CM | POA: Diagnosis not present

## 2020-09-04 DIAGNOSIS — Z0001 Encounter for general adult medical examination with abnormal findings: Secondary | ICD-10-CM

## 2020-09-04 DIAGNOSIS — Z0000 Encounter for general adult medical examination without abnormal findings: Secondary | ICD-10-CM

## 2020-09-04 DIAGNOSIS — K219 Gastro-esophageal reflux disease without esophagitis: Secondary | ICD-10-CM

## 2020-09-04 DIAGNOSIS — K5909 Other constipation: Secondary | ICD-10-CM

## 2020-09-04 MED ORDER — NAPROXEN 500 MG PO TABS
500.0000 mg | ORAL_TABLET | Freq: Two times a day (BID) | ORAL | 2 refills | Status: DC | PRN
Start: 2020-09-04 — End: 2020-12-24

## 2020-09-04 MED ORDER — PANTOPRAZOLE SODIUM 40 MG PO TBEC: DELAYED_RELEASE_TABLET | ORAL | 3 refills | Status: AC

## 2020-09-04 NOTE — Progress Notes (Signed)
Connie Logan is a 39 y.o. female presents to office today for annual physical exam examination.    Concerns today include: 1.  None  Occupation: Charity fundraiser, Marital status: Married, Substance use: Rare alcohol Diet: High in fiber, water, Exercise: Tries to stay active Last pap smear: Sees Dr. Ronita Hipps  Past Medical History:  Diagnosis Date  . GERD (gastroesophageal reflux disease)   . Gestational edema, postpartum 08/27/2015  . Headache    migraines  . Kidney stones    Social History   Socioeconomic History  . Marital status: Married    Spouse name: Not on file  . Number of children: Not on file  . Years of education: Not on file  . Highest education level: Not on file  Occupational History  . Not on file  Tobacco Use  . Smoking status: Never Smoker  . Smokeless tobacco: Never Used  Vaping Use  . Vaping Use: Never used  Substance and Sexual Activity  . Alcohol use: Yes    Comment: occ  . Drug use: No  . Sexual activity: Yes    Birth control/protection: Pill  Other Topics Concern  . Not on file  Social History Narrative  . Not on file   Social Determinants of Health   Financial Resource Strain:   . Difficulty of Paying Living Expenses: Not on file  Food Insecurity:   . Worried About Charity fundraiser in the Last Year: Not on file  . Ran Out of Food in the Last Year: Not on file  Transportation Needs:   . Lack of Transportation (Medical): Not on file  . Lack of Transportation (Non-Medical): Not on file  Physical Activity:   . Days of Exercise per Week: Not on file  . Minutes of Exercise per Session: Not on file  Stress:   . Feeling of Stress : Not on file  Social Connections:   . Frequency of Communication with Friends and Family: Not on file  . Frequency of Social Gatherings with Friends and Family: Not on file  . Attends Religious Services: Not on file  . Active Member of Clubs or Organizations: Not on file  . Attends Archivist  Meetings: Not on file  . Marital Status: Not on file  Intimate Partner Violence:   . Fear of Current or Ex-Partner: Not on file  . Emotionally Abused: Not on file  . Physically Abused: Not on file  . Sexually Abused: Not on file   Past Surgical History:  Procedure Laterality Date  . CESAREAN SECTION N/A 08/24/2015   Procedure: CESAREAN SECTION;  Surgeon: Brien Few, MD;  Location: [facility];  Service: Obstetrics;  Laterality: N/A;  . ROBOTIC ASSISTED DIAGNOSTIC LAPAROSCOPY N/A 10/11/2014   Procedure: ROBOTIC ASSISTED DIAGNOSTIC LAPAROSCOPY EXCISION/ABLATION OF ENDOMETRIOSIS ;  Surgeon: Lovenia Kim, MD;  Location: [facility];  Service: Gynecology;  Laterality: N/A;  . WISDOM TOOTH EXTRACTION     Family History  Problem Relation Age of Onset  . Hypertension Mother   . Cancer Mother   . Cancer Maternal Grandmother   . Diabetes Father     Current Outpatient Medications:  .  acetaminophen (TYLENOL) 500 MG tablet, Take 1 tablet (500 mg total) by mouth every 6 (six) hours as needed., Disp: 30 tablet, Rfl: 0 .  Levonorgestrel-Ethinyl Estradiol (AMETHIA,CAMRESE) 0.15-0.03 &0.01 MG tablet, Take 1 tablet by mouth daily., Disp: , Rfl: 4 .  naproxen (NAPROSYN) 500 MG tablet, Take 1 tablet (500 mg total) by mouth  2 (two) times daily as needed., Disp: 40 tablet, Rfl: 2 .  pantoprazole (PROTONIX) 40 MG tablet, TAKE 1 TABLET BY MOUTH DAILY X2 WEEKS. THEN TRIAL OFF. MAY RESUME IF SYMPTOMS RETURN., Disp: 90 tablet, Rfl: 1  Allergies  Allergen Reactions  . Augmentin [Amoxicillin-Pot Clavulanate] Other (See Comments)    Headaches, vision changes   . Septra [Sulfamethoxazole-Trimethoprim] Rash     ROS: Review of Systems A comprehensive review of systems was negative except for: Gastrointestinal: positive for constipation and dyspepsia    Physical exam BP 137/89   Pulse 68   Temp 97.8 F (36.6 C)   Ht 5' 3"  (1.6 m)   Wt 173 lb 6.4 oz (78.7 kg)   LMP 08/31/2020   SpO2 97%   BMI 30.72 kg/m   General appearance: alert, cooperative, appears stated age and no distress Head: Normocephalic, without obvious abnormality, atraumatic Eyes: negative findings: lids and lashes normal, conjunctivae and sclerae normal, corneas clear and pupils equal, round, reactive to light and accomodation Ears: normal TM's and external ear canals both ears Nose: Nares normal. Septum midline. Mucosa normal. No drainage or sinus tenderness. Throat: lips, mucosa, and tongue normal; teeth and gums normal Neck: no adenopathy, supple, symmetrical, trachea midline and thyroid not enlarged, symmetric, no tenderness/mass/nodules Back: symmetric, no curvature. ROM normal. No CVA tenderness. Lungs: clear to auscultation bilaterally Heart: regular rate and rhythm, S1, S2 normal, no murmur, click, rub or gallop Abdomen: soft, non-tender; bowel sounds normal; no masses,  no organomegaly Extremities: extremities normal, atraumatic, no cyanosis or edema Pulses: 2+ and symmetric Skin: Skin color, texture, turgor normal. No rashes or lesions Lymph nodes: Cervical, supraclavicular, and axillary nodes normal. Neurologic: Alert and oriented X 3, normal strength and tone. Normal symmetric reflexes. Normal coordination and gait Psych: Mood stable, speech normal, affect appropriate    Assessment/ Plan: Connie Logan here for annual physical exam.   Annual physical exam  Unknown status of immunity to COVID-19 virus - Plan: SARS-CoV-2 Semi-Quantitative Total Antibody, Spike  Obesity (BMI 30-39.9) - Plan: Lipid panel, CMP14+EGFR, TSH, VITAMIN D 25 Hydroxy (Vit-D Deficiency, Fractures)  Chronic constipation  Gastroesophageal reflux disease without esophagitis - Plan: pantoprazole (PROTONIX) 40 MG tablet  Will plan for fasting labs.  We will collect Covid screen as well since she has not been vaccinated.  Protonix continued for GERD which has been well controlled.  She has responded well to previous Linzess samples.   She will contact me with which dose works well for her and we will plan to prescribe  Release of information form completed to send to Dr. Kennith Maes office for her Pap smear results  Counseled on healthy lifestyle choices, including diet (rich in fruits, vegetables and lean meats and low in salt and simple carbohydrates) and exercise (at least 30 minutes of moderate physical activity daily).  Patient to follow up in 1 year for annual exam or sooner if needed.  Connie Logan M. Lajuana Ripple, DO

## 2020-09-04 NOTE — Patient Instructions (Signed)
Let me know dose of Linzess   Preventive Care 37-37 Years Old, Female Preventive care refers to visits with your health care provider and lifestyle choices that can promote health and wellness. This includes:  A yearly physical exam. This may also be called an annual well check.  Regular dental visits and eye exams.  Immunizations.  Screening for certain conditions.  Healthy lifestyle choices, such as eating a healthy diet, getting regular exercise, not using drugs or products that contain nicotine and tobacco, and limiting alcohol use. What can I expect for my preventive care visit? Physical exam Your health care provider will check your:  Height and weight. This may be used to calculate body mass index (BMI), which tells if you are at a healthy weight.  Heart rate and blood pressure.  Skin for abnormal spots. Counseling Your health care provider may ask you questions about your:  Alcohol, tobacco, and drug use.  Emotional well-being.  Home and relationship well-being.  Sexual activity.  Eating habits.  Work and work Statistician.  Method of birth control.  Menstrual cycle.  Pregnancy history. What immunizations do I need?  Influenza (flu) vaccine  This is recommended every year. Tetanus, diphtheria, and pertussis (Tdap) vaccine  You may need a Td booster every 10 years. Varicella (chickenpox) vaccine  You may need this if you have not been vaccinated. Human papillomavirus (HPV) vaccine  If recommended by your health care provider, you may need three doses over 6 months. Measles, mumps, and rubella (MMR) vaccine  You may need at least one dose of MMR. You may also need a second dose. Meningococcal conjugate (MenACWY) vaccine  One dose is recommended if you are age 25-21 years and a first-year college student living in a residence hall, or if you have one of several medical conditions. You may also need additional booster doses. Pneumococcal conjugate  (PCV13) vaccine  You may need this if you have certain conditions and were not previously vaccinated. Pneumococcal polysaccharide (PPSV23) vaccine  You may need one or two doses if you smoke cigarettes or if you have certain conditions. Hepatitis A vaccine  You may need this if you have certain conditions or if you travel or work in places where you may be exposed to hepatitis A. Hepatitis B vaccine  You may need this if you have certain conditions or if you travel or work in places where you may be exposed to hepatitis B. Haemophilus influenzae type b (Hib) vaccine  You may need this if you have certain conditions. You may receive vaccines as individual doses or as more than one vaccine together in one shot (combination vaccines). Talk with your health care provider about the risks and benefits of combination vaccines. What tests do I need?  Blood tests  Lipid and cholesterol levels. These may be checked every 5 years starting at age 37.  Hepatitis C test.  Hepatitis B test. Screening  Diabetes screening. This is done by checking your blood sugar (glucose) after you have not eaten for a while (fasting).  Sexually transmitted disease (STD) testing.  BRCA-related cancer screening. This may be done if you have a family history of breast, ovarian, tubal, or peritoneal cancers.  Pelvic exam and Pap test. This may be done every 3 years starting at age 37. Starting at age 16, this may be done every 5 years if you have a Pap test in combination with an HPV test. Talk with your health care provider about your test results, treatment options,  and if necessary, the need for more tests. Follow these instructions at home: Eating and drinking   Eat a diet that includes fresh fruits and vegetables, whole grains, lean protein, and low-fat dairy.  Take vitamin and mineral supplements as recommended by your health care provider.  Do not drink alcohol if: ? Your health care provider tells  you not to drink. ? You are pregnant, may be pregnant, or are planning to become pregnant.  If you drink alcohol: ? Limit how much you have to 0-1 drink a day. ? Be aware of how much alcohol is in your drink. In the [place] one drink equals one 12 oz bottle of beer (355 mL), one 5 oz glass of wine (148 mL), or one 1 oz glass of hard liquor (44 mL). Lifestyle  Take daily care of your teeth and gums.  Stay active. Exercise for at least 30 minutes on 5 or more days each week.  Do not use any products that contain nicotine or tobacco, such as cigarettes, e-cigarettes, and chewing tobacco. If you need help quitting, ask your health care provider.  If you are sexually active, practice safe sex. Use a condom or other form of birth control (contraception) in order to prevent pregnancy and STIs (sexually transmitted infections). If you plan to become pregnant, see your health care provider for a preconception visit. What's next?  Visit your health care provider once a year for a well check visit.  Ask your health care provider how often you should have your eyes and teeth checked.  Stay up to date on all vaccines. This information is not intended to replace advice given to you by your health care provider. Make sure you discuss any questions you have with your health care provider. Document Revised: 05/26/2018 Document Reviewed: 05/26/2018 Elsevier Patient Education  2020 [employer].

## 2020-09-05 ENCOUNTER — Other Ambulatory Visit: Payer: Self-pay

## 2020-09-05 DIAGNOSIS — E669 Obesity, unspecified: Secondary | ICD-10-CM

## 2020-09-05 DIAGNOSIS — Z789 Other specified health status: Secondary | ICD-10-CM

## 2020-09-06 LAB — CMP14+EGFR
ALT: 18 IU/L (ref 0–32)
AST: 23 IU/L (ref 0–40)
Albumin/Globulin Ratio: 1.4 (ref 1.2–2.2)
Albumin: 4.1 g/dL (ref 3.8–4.8)
Alkaline Phosphatase: 76 IU/L (ref 44–121)
BUN/Creatinine Ratio: 11 (ref 9–23)
BUN: 9 mg/dL (ref 6–20)
Bilirubin Total: 0.3 mg/dL (ref 0.0–1.2)
CO2: 22 mmol/L (ref 20–29)
Calcium: 8.9 mg/dL (ref 8.7–10.2)
Chloride: 102 mmol/L (ref 96–106)
Creatinine, Ser: 0.83 mg/dL (ref 0.57–1.00)
GFR calc Af Amer: 104 mL/min/{1.73_m2} (ref >59)
GFR calc non Af Amer: 90 mL/min/{1.73_m2} (ref >59)
Globulin, Total: 2.9 g/dL (ref 1.5–4.5)
Glucose: 82 mg/dL (ref 65–99)
Potassium: 4.4 mmol/L (ref 3.5–5.2)
Sodium: 138 mmol/L (ref 134–144)
Total Protein: 7 g/dL (ref 6.0–8.5)

## 2020-09-06 LAB — TSH: TSH: 1.48 u[IU]/mL (ref 0.450–4.500)

## 2020-09-06 LAB — LIPID PANEL
Chol/HDL Ratio: 3.5 ratio (ref 0.0–4.4)
Cholesterol, Total: 224 mg/dL — ABNORMAL HIGH (ref 100–199)
HDL: 64 mg/dL (ref >39)
LDL Chol Calc (NIH): 140 mg/dL — ABNORMAL HIGH (ref 0–99)
Triglycerides: 116 mg/dL (ref 0–149)
VLDL Cholesterol Cal: 20 mg/dL (ref 5–40)

## 2020-09-06 LAB — SARS-COV-2 SEMI-QUANTITATIVE TOTAL ANTIBODY, SPIKE
SARS-CoV-2 Semi-Quant Total Ab: <0.8 U/mL (ref <0.8)
SARS-CoV-2 Spike Ab Interp: NEGATIVE

## 2020-09-06 LAB — VITAMIN D 25 HYDROXY (VIT D DEFICIENCY, FRACTURES): Vit D, 25-Hydroxy: 35.6 ng/mL (ref 30.0–100.0)

## 2020-12-23 ENCOUNTER — Other Ambulatory Visit: Payer: Self-pay | Admitting: Family Medicine

## 2021-06-14 IMAGING — DX DG KNEE AP/LAT W/ SUNRISE*R*
2 series · 2 of 2 positions shown · non-contrast
Comparison: None.

CLINICAL DATA: Knee pain

EXAM:
LEFT KNEE 3 VIEWS; RIGHT KNEE 3 VIEWS

[knee lat]
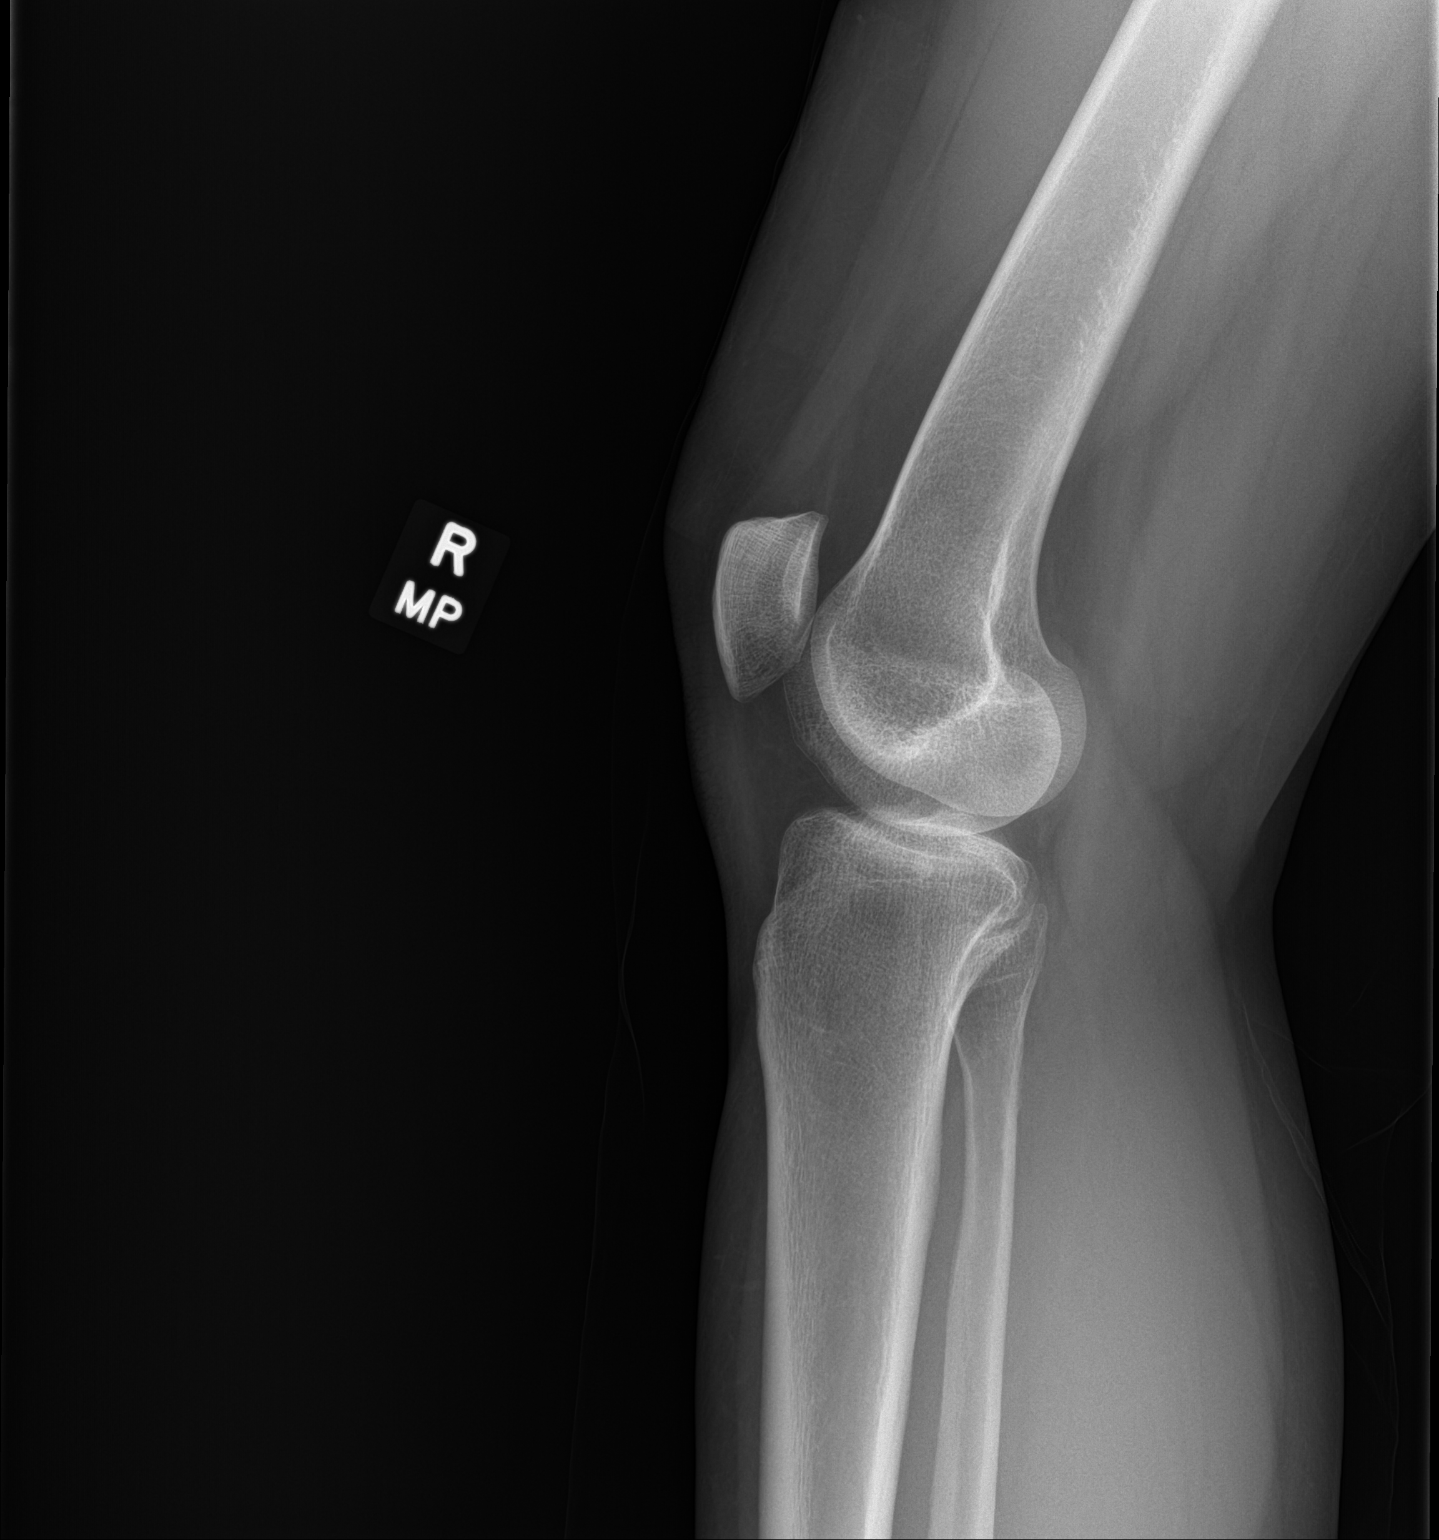

[patella skyline]
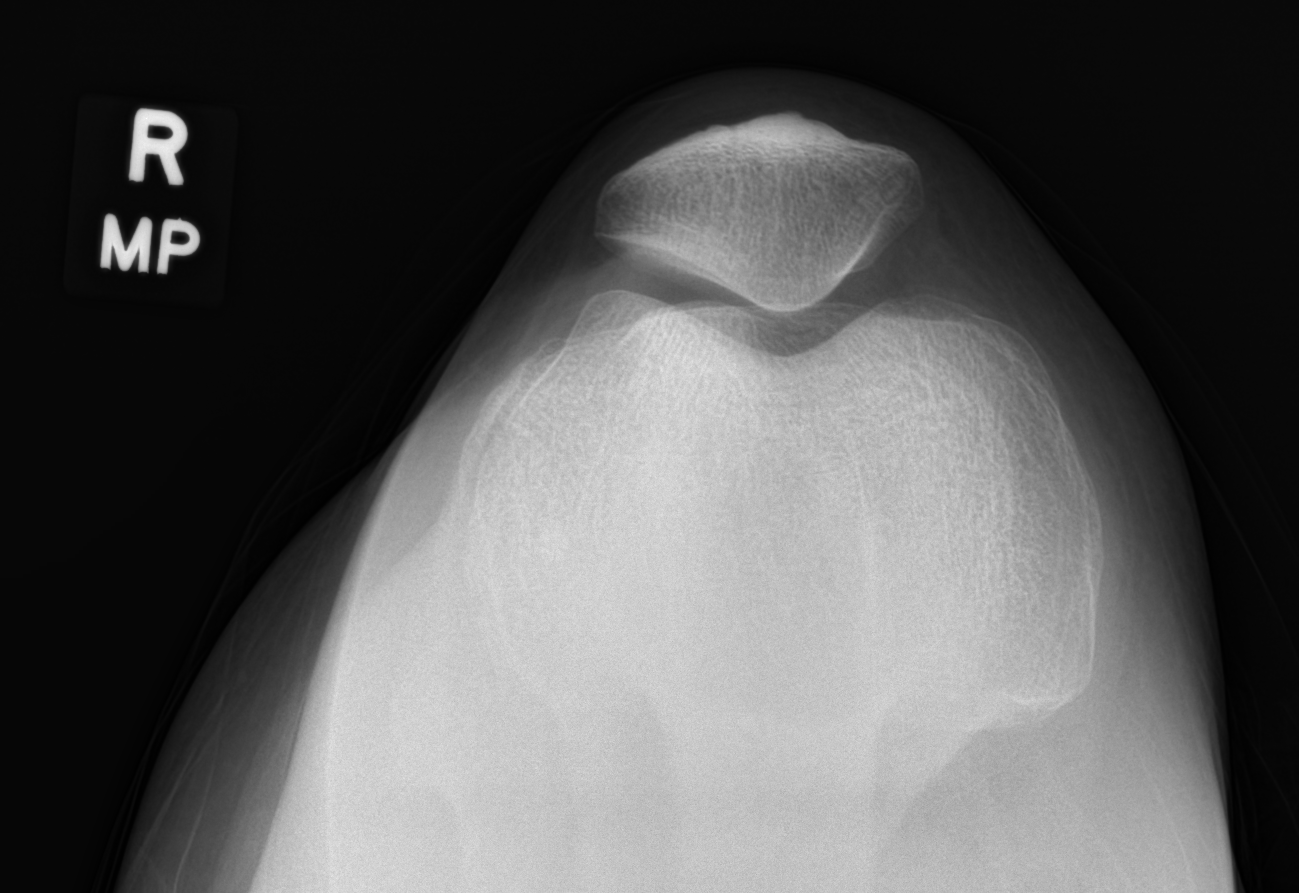

[2 of 2 positions shown; findings below may reference images not displayed]

FINDINGS: No fracture or dislocation of the bilateral knees. Joint spaces are
preserved. No knee joint effusion. Soft tissues are unremarkable.
IMPRESSION: No fracture or dislocation of the bilateral knees. Joint spaces are
preserved. No knee joint effusion.

## 2021-06-14 IMAGING — DX DG KNEE AP/LAT W/ SUNRISE*L*
3 series · 3 of 3 positions shown · non-contrast
Comparison: None.

CLINICAL DATA: Knee pain

EXAM:
LEFT KNEE 3 VIEWS; RIGHT KNEE 3 VIEWS

[knee ap]
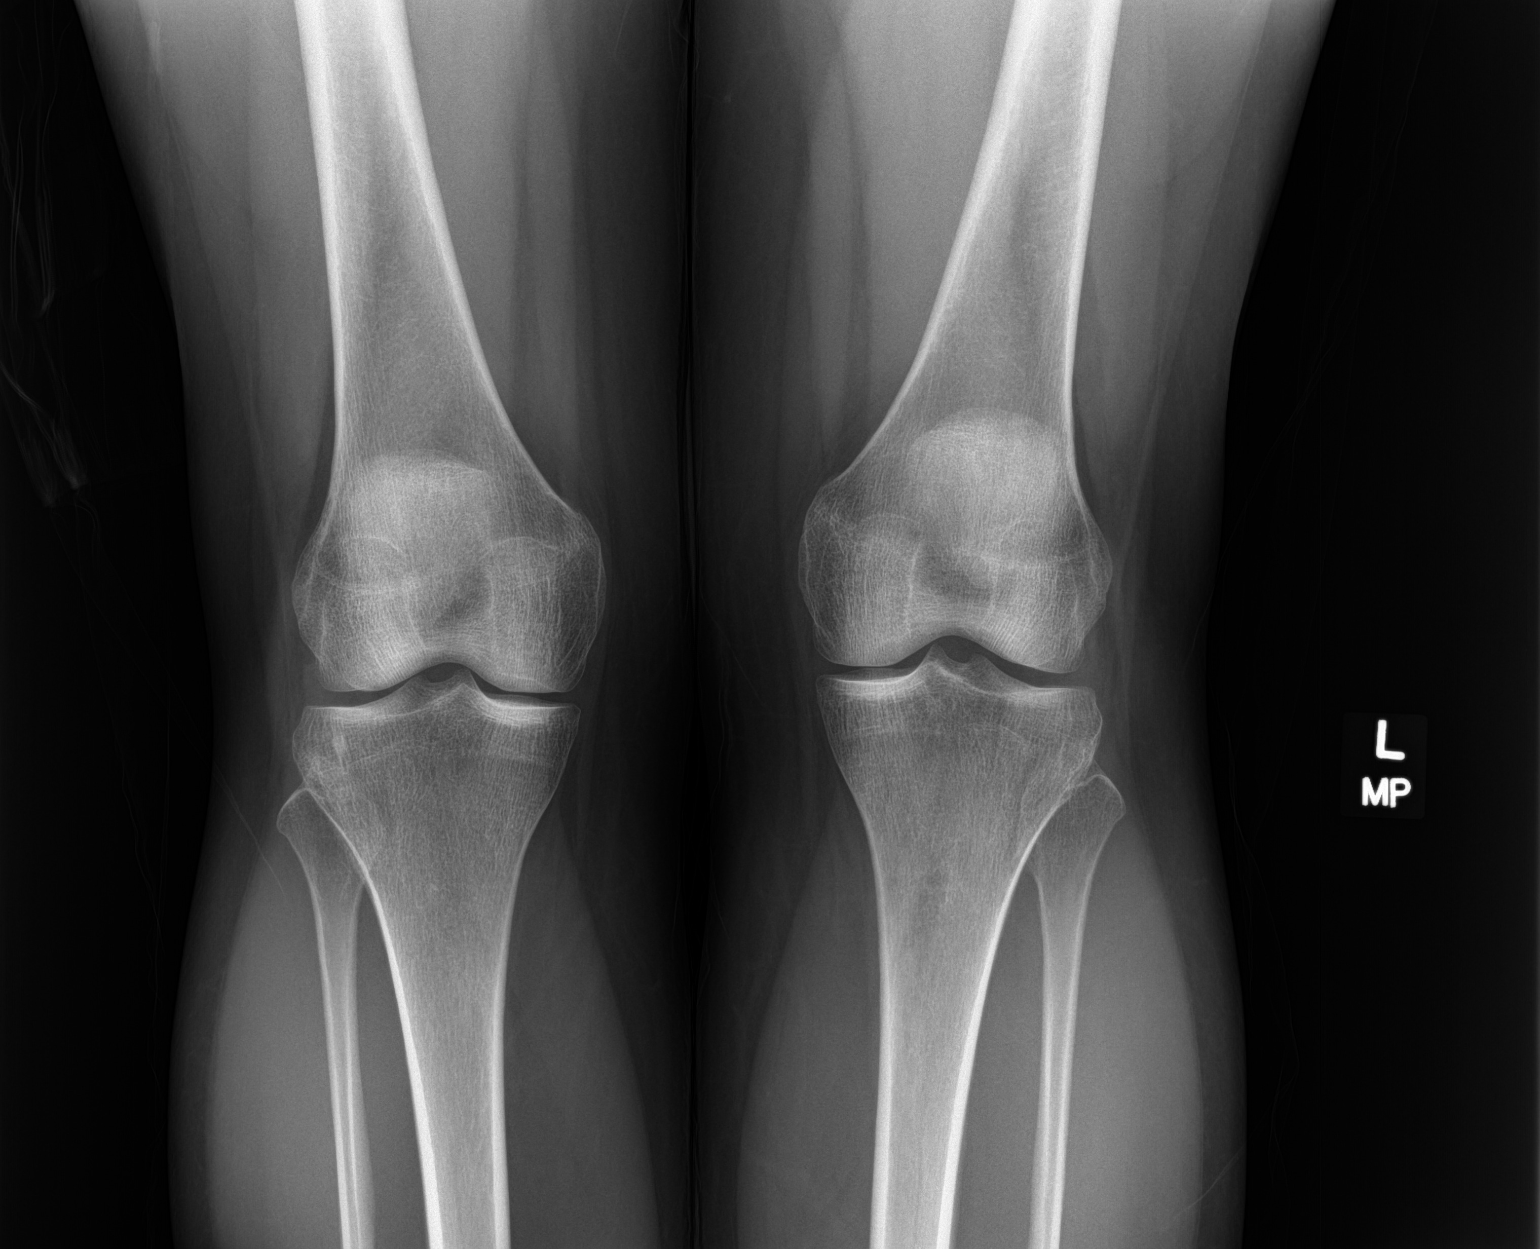

[knee lat]
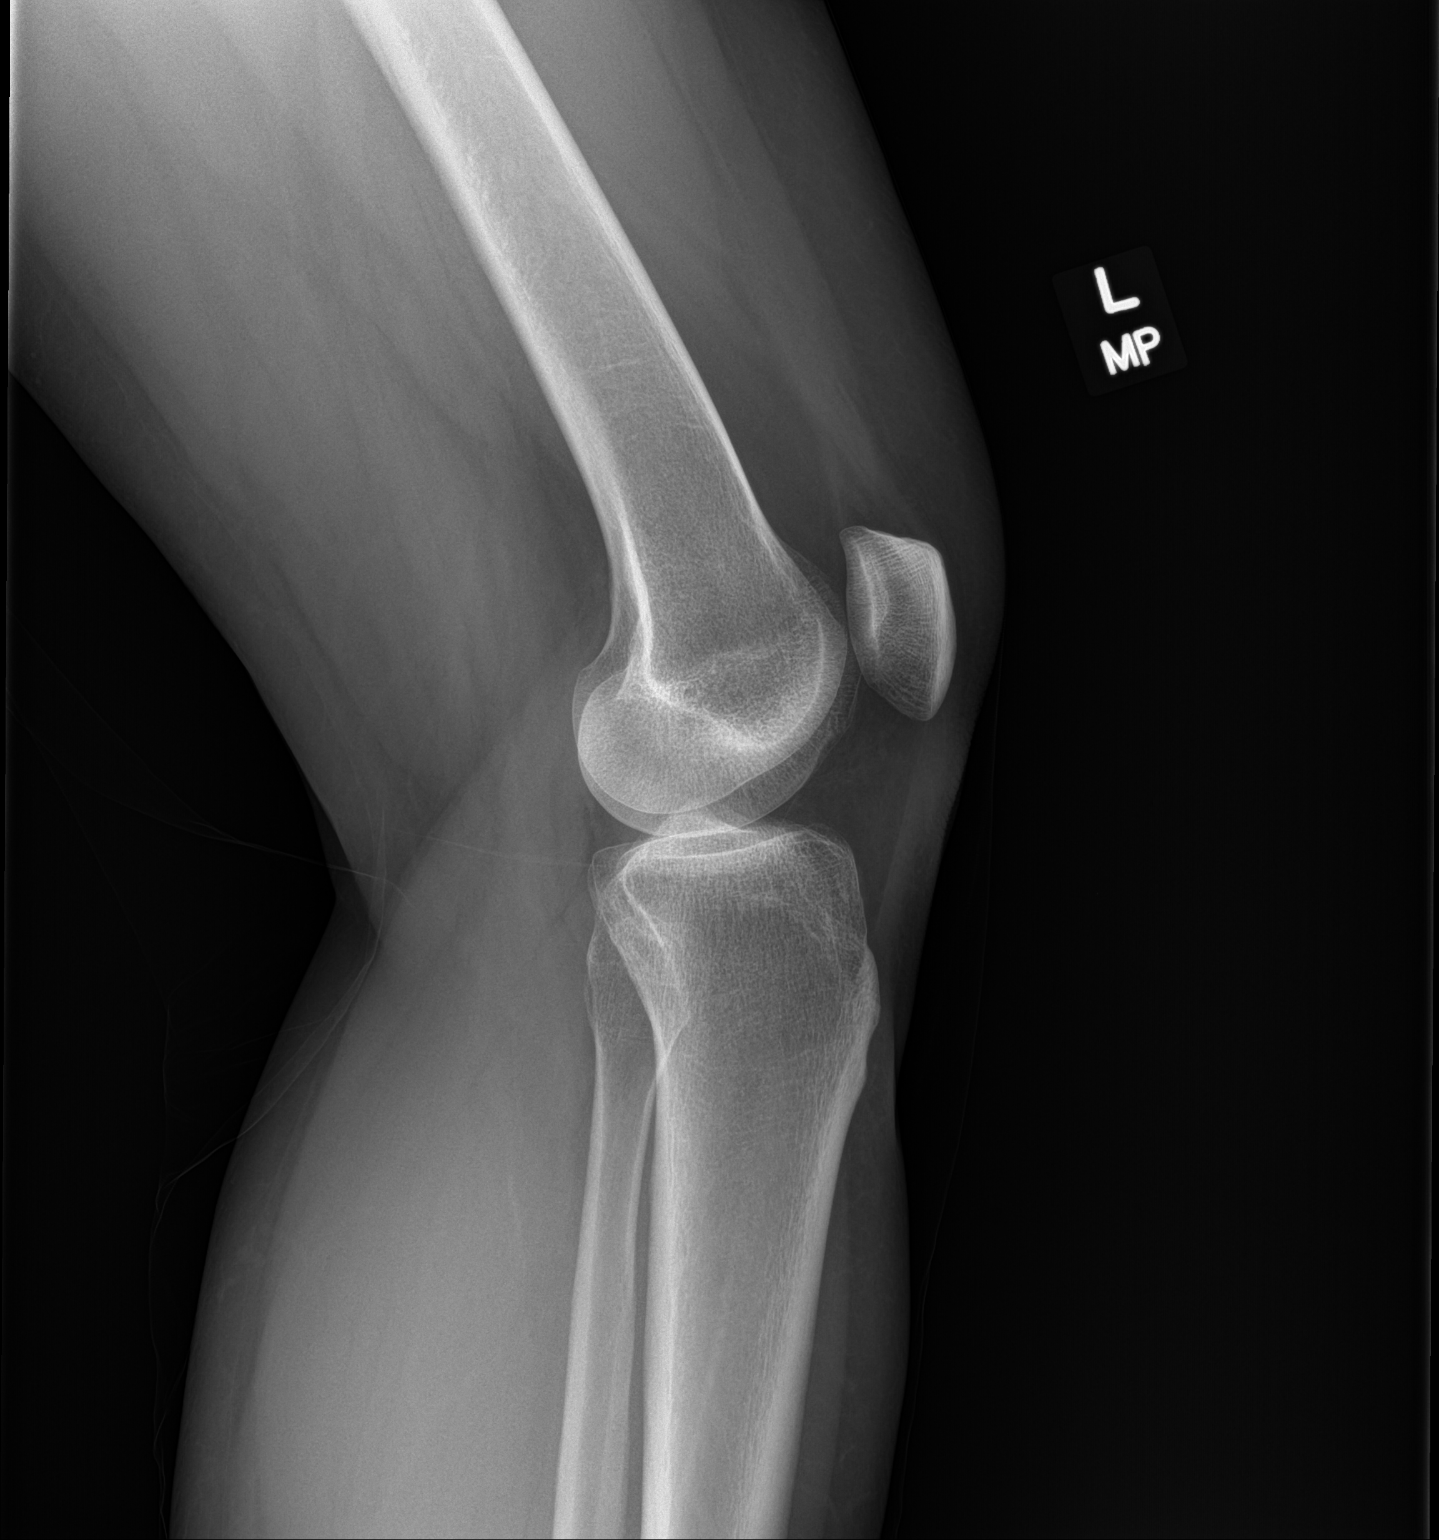

[patella skyline]
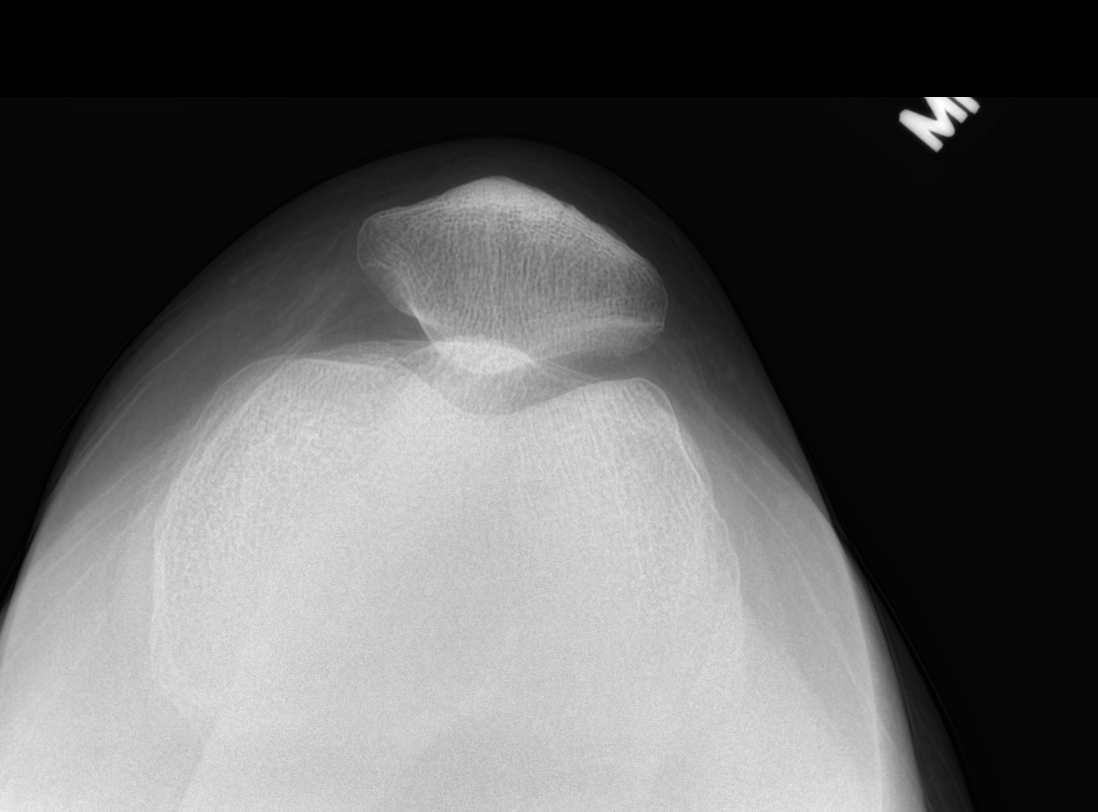

[3 of 3 positions shown; findings below may reference images not displayed]

FINDINGS: No fracture or dislocation of the bilateral knees. Joint spaces are
preserved. No knee joint effusion. Soft tissues are unremarkable.
IMPRESSION: No fracture or dislocation of the bilateral knees. Joint spaces are
preserved. No knee joint effusion.

## 2021-09-08 ENCOUNTER — Ambulatory Visit (INDEPENDENT_AMBULATORY_CARE_PROVIDER_SITE_OTHER): Payer: PRIVATE HEALTH INSURANCE | Admitting: Family Medicine

## 2021-09-08 ENCOUNTER — Encounter: Payer: Self-pay | Admitting: Family Medicine

## 2021-09-08 VITALS — BP 126/77 | HR 72 | Temp 97.9°F | Ht 63.0 in | Wt 171.6 lb

## 2021-09-08 DIAGNOSIS — E669 Obesity, unspecified: Secondary | ICD-10-CM

## 2021-09-08 DIAGNOSIS — K219 Gastro-esophageal reflux disease without esophagitis: Secondary | ICD-10-CM | POA: Diagnosis not present

## 2021-09-08 DIAGNOSIS — Z0001 Encounter for general adult medical examination with abnormal findings: Secondary | ICD-10-CM | POA: Diagnosis not present

## 2021-09-08 DIAGNOSIS — Z0000 Encounter for general adult medical examination without abnormal findings: Secondary | ICD-10-CM

## 2021-09-08 LAB — CMP14+EGFR
ALT: 11 IU/L (ref 0–32)
AST: 19 IU/L (ref 0–40)
Albumin/Globulin Ratio: 1.5 (ref 1.2–2.2)
Albumin: 4.3 g/dL (ref 3.8–4.8)
Alkaline Phosphatase: 76 IU/L (ref 44–121)
BUN/Creatinine Ratio: 9 (ref 9–23)
BUN: 8 mg/dL (ref 6–20)
Bilirubin Total: 0.4 mg/dL (ref 0.0–1.2)
CO2: 22 mmol/L (ref 20–29)
Calcium: 9.1 mg/dL (ref 8.7–10.2)
Chloride: 101 mmol/L (ref 96–106)
Creatinine, Ser: 0.86 mg/dL (ref 0.57–1.00)
Globulin, Total: 2.9 g/dL (ref 1.5–4.5)
Glucose: 83 mg/dL (ref 70–99)
Potassium: 4.3 mmol/L (ref 3.5–5.2)
Sodium: 136 mmol/L (ref 134–144)
Total Protein: 7.2 g/dL (ref 6.0–8.5)
eGFR: 89 mL/min/{1.73_m2} (ref >59)

## 2021-09-08 LAB — LIPID PANEL
Chol/HDL Ratio: 3.5 ratio (ref 0.0–4.4)
Cholesterol, Total: 196 mg/dL (ref 100–199)
HDL: 56 mg/dL (ref >39)
LDL Chol Calc (NIH): 116 mg/dL — ABNORMAL HIGH (ref 0–99)
Triglycerides: 139 mg/dL (ref 0–149)
VLDL Cholesterol Cal: 24 mg/dL (ref 5–40)

## 2021-09-08 LAB — CBC
Hematocrit: 38.9 % (ref 34.0–46.6)
Hemoglobin: 13.2 g/dL (ref 11.1–15.9)
MCH: 29.1 pg (ref 26.6–33.0)
MCHC: 33.9 g/dL (ref 31.5–35.7)
MCV: 86 fL (ref 79–97)
Platelets: 394 10*3/uL (ref 150–450)
RBC: 4.53 x10E6/uL (ref 3.77–5.28)
RDW: 12.1 % (ref 11.7–15.4)
WBC: 9.6 10*3/uL (ref 3.4–10.8)

## 2021-09-08 LAB — TSH: TSH: 1.29 u[IU]/mL (ref 0.450–4.500)

## 2021-09-08 MED ORDER — FAMOTIDINE 20 MG PO TABS
20.0000 mg | ORAL_TABLET | Freq: Two times a day (BID) | ORAL | 3 refills | Status: AC | PRN
Start: 2021-09-08 — End: ?

## 2021-09-08 NOTE — Progress Notes (Signed)
Connie Logan is a 40 y.o. female presents to office today for annual physical exam examination.    Concerns today include: 1.  GERD Very rare GERD but PPI does not seem to be working well enough when she does trying to preventively take it for certain foods.  Pepcid has worked.  No GI bleeding, nausea, vomiting or p.o. intolerance  Marital status: married, Substance use: none Diet: balanced Last pap smear: UTD Refills needed today: none Immunizations needed: Immunization History  Administered Date(s) Administered   Hepatitis B 08/04/1994, 09/01/1994, 02/16/1995   Influenza,Quad,Nasal, Live 08/18/2018   Influenza,inj,Quad PF,6+ Mos 07/11/2015, 07/30/2016, 09/08/2017, 08/10/2019   Tdap 07/24/2015     Past Medical History:  Diagnosis Date   GERD (gastroesophageal reflux disease)    Gestational edema, postpartum 08/27/2015   Headache    migraines   Kidney stones    Social History   Socioeconomic History   Marital status: Married    Spouse name: Not on file   Number of children: Not on file   Years of education: Not on file   Highest education level: Not on file  Occupational History   Not on file  Tobacco Use   Smoking status: Never   Smokeless tobacco: Never  Vaping Use   Vaping Use: Never used  Substance and Sexual Activity   Alcohol use: Yes    Comment: occ   Drug use: No   Sexual activity: Yes    Birth control/protection: Pill  Other Topics Concern   Not on file  Social History Narrative   Not on file   Social Determinants of Health   Financial Resource Strain: Not on file  Food Insecurity: Not on file  Transportation Needs: Not on file  Physical Activity: Not on file  Stress: Not on file  Social Connections: Not on file  Intimate Partner Violence: Not on file   Past Surgical History:  Procedure Laterality Date   CESAREAN SECTION N/A 08/24/2015   Procedure: CESAREAN SECTION;  Surgeon: Connie Few, MD;  Location: [facility];  Service: Obstetrics;   Laterality: N/A;   ROBOTIC ASSISTED DIAGNOSTIC LAPAROSCOPY N/A 10/11/2014   Procedure: ROBOTIC ASSISTED DIAGNOSTIC LAPAROSCOPY EXCISION/ABLATION OF ENDOMETRIOSIS ;  Surgeon: Connie Kim, MD;  Location: [facility];  Service: Gynecology;  Laterality: N/A;   WISDOM TOOTH EXTRACTION     Family History  Problem Relation Age of Onset   Hypertension Mother    Cancer Mother    Cancer Maternal Grandmother    Diabetes Father     Current Outpatient Medications:    acetaminophen (TYLENOL) 500 MG tablet, Take 1 tablet (500 mg total) by mouth every 6 (six) hours as needed., Disp: 30 tablet, Rfl: 0   Levonorgestrel-Ethinyl Estradiol (AMETHIA,CAMRESE) 0.15-0.03 &0.01 MG tablet, Take 1 tablet by mouth daily., Disp: , Rfl: 4   naproxen (NAPROSYN) 500 MG tablet, TAKE 1 TABLET BY MOUTH TWICE A DAY AS NEEDED, Disp: 40 tablet, Rfl: 2   pantoprazole (PROTONIX) 40 MG tablet, Take 1 tablet daily for reflux., Disp: 90 tablet, Rfl: 3  Allergies  Allergen Reactions   Augmentin [Amoxicillin-Pot Clavulanate] Other (See Comments)    Headaches, vision changes    Septra [Sulfamethoxazole-Trimethoprim] Rash     ROS: Review of Systems Pertinent items noted in HPI and remainder of comprehensive ROS otherwise negative.    Physical exam BP 126/77   Pulse 72   Temp 97.9 F (36.6 C)   Ht 5' 3"  (1.6 m)   Wt 171 lb 9.6  oz (77.8 kg)   LMP 07/03/2021   SpO2 98%   BMI 30.40 kg/m  General appearance: alert, cooperative, appears stated age, and no distress Head: Normocephalic, without obvious abnormality, atraumatic Eyes: negative findings: lids and lashes normal, conjunctivae and sclerae normal, corneas clear, and pupils equal, round, reactive to light and accomodation Ears: normal TM's and external ear canals both ears Nose: Nares normal. Septum midline. Mucosa normal. No drainage or sinus tenderness. Throat: lips, mucosa, and tongue normal; teeth and gums normal Neck: no adenopathy, supple, symmetrical, trachea  midline, and thyroid not enlarged, symmetric, no tenderness/mass/nodules Back: symmetric, no curvature. ROM normal. No CVA tenderness. Lungs: clear to auscultation bilaterally Heart: regular rate and rhythm, S1, S2 normal, no murmur, click, rub or gallop Abdomen: soft, non-tender; bowel sounds normal; no masses,  no organomegaly Extremities: extremities normal, atraumatic, no cyanosis or edema Pulses: 2+ and symmetric Skin: Skin color, texture, turgor normal. No rashes or lesions Lymph nodes: Cervical, supraclavicular, and axillary nodes normal. Neurologic: Grossly normal Psych: Mood happy.  Patient pleasant and interactive  Depression screen [facility] 2/9 09/08/2021 09/04/2020 04/12/2020  Decreased Interest 0 0 0  Down, Depressed, Hopeless 0 0 0  PHQ - 2 Score 0 0 0  Altered sleeping - - -  Tired, decreased energy - - -  Change in appetite - - -  Feeling bad or failure about yourself  - - -  Trouble concentrating - - -  Moving slowly or fidgety/restless - - -  Suicidal thoughts - - -  PHQ-9 Score - - -   Assessment/ Plan: Connie Logan here for annual physical exam.   Annual physical exam  Gastroesophageal reflux disease without esophagitis - Plan: CBC, famotidine (PEPCID) 20 MG tablet  Obesity (BMI 30-39.9) - Plan: Lipid Panel, CMP14+EGFR, TSH  Up-to-date on preventative healthcare.  Fasting labs obtained today.  GERD is intermittent.  Protonix unfortunately is not acting quickly enough so we will replace with Pepcid as needed.  Patient to follow up in 1 year for annual exam or sooner if needed.  Connie Logan M. Lajuana Ripple, DO

## 2023-08-13 LAB — HM PAP SMEAR: HPV, high-risk: NEGATIVE

## 2023-08-16 ENCOUNTER — Encounter: Payer: Self-pay | Admitting: Family Medicine

## 2024-08-11 LAB — HM MAMMOGRAPHY

## 2024-08-14 ENCOUNTER — Encounter: Payer: Self-pay | Admitting: Family Medicine

## 2024-08-14 DIAGNOSIS — E7800 Pure hypercholesterolemia, unspecified: Secondary | ICD-10-CM

## 2024-08-23 ENCOUNTER — Other Ambulatory Visit: Payer: Self-pay

## 2024-08-23 DIAGNOSIS — E7800 Pure hypercholesterolemia, unspecified: Secondary | ICD-10-CM

## 2024-08-23 LAB — LIPID PANEL
Chol/HDL Ratio: 2.7 ratio (ref 0.0–4.4)
Cholesterol, Total: 193 mg/dL (ref 100–199)
HDL: 71 mg/dL (ref >39)
LDL Chol Calc (NIH): 108 mg/dL — ABNORMAL HIGH (ref 0–99)
Triglycerides: 79 mg/dL (ref 0–149)
VLDL Cholesterol Cal: 14 mg/dL (ref 5–40)

## 2024-08-28 ENCOUNTER — Ambulatory Visit: Payer: Self-pay | Admitting: Family Medicine

## 2024-08-29 ENCOUNTER — Encounter: Payer: Self-pay | Admitting: Family Medicine

## 2024-08-29 ENCOUNTER — Telehealth: Payer: Self-pay | Admitting: Family Medicine

## 2024-08-29 DIAGNOSIS — N809 Endometriosis, unspecified: Secondary | ICD-10-CM | POA: Insufficient documentation

## 2024-08-29 DIAGNOSIS — E7800 Pure hypercholesterolemia, unspecified: Secondary | ICD-10-CM | POA: Diagnosis not present

## 2024-08-29 NOTE — Progress Notes (Signed)
 MyChart Video visit  Subjective: CC: follow up HLD PCP: Jolinda Norene HERO, DO YEP:Uzdy HERO Doering is a 43 y.o. female. Patient provides verbal consent for consult held via video.  Due to COVID-19 pandemic this visit was conducted virtually. This visit type was conducted due to national recommendations for restrictions regarding the COVID-19 Pandemic (e.g. social distancing, sheltering in place) in an effort to limit this patient's exposure and mitigate transmission in our community. All issues noted in this document were discussed and addressed.  A physical exam was not performed with this format.   Location of patient: work Location of provider: [facility] Others present for call: none  1. HLD  Reports no chest pain, shortness of breath, change in exercise tolerance.  She continues to try and keep a clean diet and stay as active as able.  Had fasting labs done recently would like to review those results  2.  Endometriosis She is under the care now with Dr. Kandyce.  Previously Dr. Gorge, who has since retired.  She notes that she continues to have quite painful periods.  They are considering IUD versus hysterectomy but apparently there is a miss- charting from 2016 where it indicates she had some type of bladder surgery during her endometriosis surgery.  She is not aware of any bladder surgery that ever occurred and has no urinary symptoms.   ROS: Per HPI  Allergies  Allergen Reactions   Augmentin  [Amoxicillin -Pot Clavulanate] Other (See Comments)    Headaches, vision changes    Septra [Sulfamethoxazole-Trimethoprim] Rash   Past Medical History:  Diagnosis Date   GERD (gastroesophageal reflux disease)    Gestational edema, postpartum 08/27/2015   Headache    migraines   Kidney stones     Current Outpatient Medications:    acetaminophen  (TYLENOL ) 500 MG tablet, Take 1 tablet (500 mg total) by mouth every 6 (six) hours as needed., Disp: 30 tablet, Rfl: 0   famotidine  (PEPCID ) 20 MG  tablet, Take 1 tablet (20 mg total) by mouth 2 (two) times daily as needed for heartburn or indigestion., Disp: 180 tablet, Rfl: 3   Levonorgestrel-Ethinyl Estradiol (AMETHIA,CAMRESE) 0.15-0.03 &0.01 MG tablet, Take 1 tablet by mouth daily., Disp: , Rfl: 4   naproxen  (NAPROSYN ) 500 MG tablet, TAKE 1 TABLET BY MOUTH TWICE A DAY AS NEEDED, Disp: 40 tablet, Rfl: 2   pantoprazole  (PROTONIX ) 40 MG tablet, Take 1 tablet daily for reflux., Disp: 90 tablet, Rfl: 3  Assessment/ Plan: 43 y.o. female   Pure hypercholesterolemia  Endometriosis  Reviewed labs.  ASCVD rescore is 0.3%.  No indication for statin medications at this time.  Reinforced balanced lifestyle including high-fiber, low-fat diet and regular exercise to reduce cardiovascular risk.  If needs preop clearance for endometriosis surgery, she will contact me.  I reviewed her medical chart could not find any indication that she is ever had any bladder surgeries or injury of bladder during surgery.  Start time: 8:29am End time: 8:39a  Total time spent on patient care (including video visit/ documentation): 11 minutes  Alfonso Carden HERO Jolinda, DO [facility] [phone]
